# Patient Record
Sex: Male | Born: 1945 | Race: White | Hispanic: No | Marital: Married | Smoking: Former smoker
Health system: Southern US, Community
[De-identification: ages and names within clinical notes are randomized; demographics above are authoritative.]

## PROBLEM LIST (undated history)

## (undated) DIAGNOSIS — K219 Gastro-esophageal reflux disease without esophagitis: Secondary | ICD-10-CM

## (undated) DIAGNOSIS — M1990 Unspecified osteoarthritis, unspecified site: Secondary | ICD-10-CM

## (undated) DIAGNOSIS — J449 Chronic obstructive pulmonary disease, unspecified: Secondary | ICD-10-CM

## (undated) DIAGNOSIS — I2510 Atherosclerotic heart disease of native coronary artery without angina pectoris: Secondary | ICD-10-CM

## (undated) HISTORY — PX: HERNIA REPAIR: SHX51

## (undated) HISTORY — PX: CATARACT EXTRACTION: SUR2

## (undated) HISTORY — PX: ROTATOR CUFF REPAIR: SHX139

## (undated) HISTORY — DX: Chronic obstructive pulmonary disease, unspecified: J44.9

## (undated) HISTORY — PX: TONSILLECTOMY: SUR1361

## (undated) HISTORY — PX: COLONOSCOPY: SHX174

## (undated) HISTORY — PX: WISDOM TOOTH EXTRACTION: SHX21

## (undated) HISTORY — DX: Atherosclerotic heart disease of native coronary artery without angina pectoris: I25.10

## (undated) HISTORY — PX: TOTAL KNEE ARTHROPLASTY: SHX125

---

## 1969-12-22 HISTORY — PX: HERNIA REPAIR: SHX51

## 2003-11-17 ENCOUNTER — Ambulatory Visit (HOSPITAL_COMMUNITY): Admission: RE | Admit: 2003-11-17 | Discharge: 2003-11-17 | Payer: Self-pay | Admitting: Family Medicine

## 2004-07-11 ENCOUNTER — Ambulatory Visit (HOSPITAL_COMMUNITY): Admission: RE | Admit: 2004-07-11 | Discharge: 2004-07-11 | Payer: Self-pay | Admitting: Family Medicine

## 2006-01-19 ENCOUNTER — Encounter: Admission: RE | Admit: 2006-01-19 | Discharge: 2006-01-19 | Payer: Self-pay | Admitting: Internal Medicine

## 2010-02-20 ENCOUNTER — Encounter: Admission: RE | Admit: 2010-02-20 | Discharge: 2010-02-20 | Payer: Self-pay | Admitting: Orthopedic Surgery

## 2010-06-12 ENCOUNTER — Inpatient Hospital Stay (HOSPITAL_COMMUNITY): Admission: RE | Admit: 2010-06-12 | Discharge: 2010-06-16 | Payer: Self-pay | Admitting: Orthopedic Surgery

## 2010-11-12 ENCOUNTER — Inpatient Hospital Stay (HOSPITAL_COMMUNITY): Admission: RE | Admit: 2010-11-12 | Discharge: 2010-11-15 | Payer: Self-pay | Admitting: Orthopedic Surgery

## 2011-03-04 LAB — CBC
HCT: 28.7 % — ABNORMAL LOW (ref 39.0–52.0)
HCT: 30.3 % — ABNORMAL LOW (ref 39.0–52.0)
HCT: 30.6 % — ABNORMAL LOW (ref 39.0–52.0)
HCT: 44.8 % (ref 39.0–52.0)
Hemoglobin: 10 g/dL — ABNORMAL LOW (ref 13.0–17.0)
Hemoglobin: 10.2 g/dL — ABNORMAL LOW (ref 13.0–17.0)
Hemoglobin: 15.3 g/dL (ref 13.0–17.0)
Hemoglobin: 9.8 g/dL — ABNORMAL LOW (ref 13.0–17.0)
MCH: 29.4 pg (ref 26.0–34.0)
MCH: 29.9 pg (ref 26.0–34.0)
MCH: 30.1 pg (ref 26.0–34.0)
MCH: 30.6 pg (ref 26.0–34.0)
MCHC: 32.7 g/dL (ref 30.0–36.0)
MCHC: 33.7 g/dL (ref 30.0–36.0)
MCHC: 34.1 g/dL (ref 30.0–36.0)
MCHC: 34.2 g/dL (ref 30.0–36.0)
MCV: 88 fL (ref 78.0–100.0)
MCV: 88.9 fL (ref 78.0–100.0)
MCV: 89.6 fL (ref 78.0–100.0)
MCV: 90 fL (ref 78.0–100.0)
Platelets: 189 10*3/uL (ref 150–400)
Platelets: 192 10*3/uL (ref 150–400)
Platelets: 195 10*3/uL (ref 150–400)
Platelets: 269 10*3/uL (ref 150–400)
RBC: 3.26 MIL/uL — ABNORMAL LOW (ref 4.22–5.81)
RBC: 3.4 MIL/uL — ABNORMAL LOW (ref 4.22–5.81)
RBC: 3.41 MIL/uL — ABNORMAL LOW (ref 4.22–5.81)
RBC: 5 MIL/uL (ref 4.22–5.81)
RDW: 13.6 % (ref 11.5–15.5)
RDW: 13.7 % (ref 11.5–15.5)
RDW: 13.7 % (ref 11.5–15.5)
RDW: 13.8 % (ref 11.5–15.5)
WBC: 10 10*3/uL (ref 4.0–10.5)
WBC: 10.2 10*3/uL (ref 4.0–10.5)
WBC: 10.8 10*3/uL — ABNORMAL HIGH (ref 4.0–10.5)
WBC: 12.9 10*3/uL — ABNORMAL HIGH (ref 4.0–10.5)

## 2011-03-04 LAB — BASIC METABOLIC PANEL
BUN: 5 mg/dL — ABNORMAL LOW (ref 6–23)
BUN: 7 mg/dL (ref 6–23)
BUN: 8 mg/dL (ref 6–23)
CO2: 27 mEq/L (ref 19–32)
CO2: 30 mEq/L (ref 19–32)
CO2: 30 mEq/L (ref 19–32)
Calcium: 7.8 mg/dL — ABNORMAL LOW (ref 8.4–10.5)
Calcium: 8 mg/dL — ABNORMAL LOW (ref 8.4–10.5)
Calcium: 9.7 mg/dL (ref 8.4–10.5)
Chloride: 100 mEq/L (ref 96–112)
Chloride: 101 mEq/L (ref 96–112)
Chloride: 99 mEq/L (ref 96–112)
Creatinine, Ser: 0.81 mg/dL (ref 0.4–1.5)
Creatinine, Ser: 0.84 mg/dL (ref 0.4–1.5)
Creatinine, Ser: 0.87 mg/dL (ref 0.4–1.5)
GFR calc Af Amer: >60 mL/min (ref >60)
GFR calc Af Amer: >60 mL/min (ref >60)
GFR calc Af Amer: >60 mL/min (ref >60)
GFR calc non Af Amer: >60 mL/min (ref >60)
GFR calc non Af Amer: >60 mL/min (ref >60)
GFR calc non Af Amer: >60 mL/min (ref >60)
Glucose, Bld: 106 mg/dL — ABNORMAL HIGH (ref 70–99)
Glucose, Bld: 113 mg/dL — ABNORMAL HIGH (ref 70–99)
Glucose, Bld: 86 mg/dL (ref 70–99)
Potassium: 3.5 mEq/L (ref 3.5–5.1)
Potassium: 4.3 mEq/L (ref 3.5–5.1)
Potassium: 4.4 mEq/L (ref 3.5–5.1)
Sodium: 133 mEq/L — ABNORMAL LOW (ref 135–145)
Sodium: 135 mEq/L (ref 135–145)
Sodium: 137 mEq/L (ref 135–145)

## 2011-03-04 LAB — PROTIME-INR
INR: 0.94 (ref 0.00–1.49)
INR: 1.09 (ref 0.00–1.49)
INR: 1.76 — ABNORMAL HIGH (ref 0.00–1.49)
INR: 2.49 — ABNORMAL HIGH (ref 0.00–1.49)
Prothrombin Time: 12.8 seconds (ref 11.6–15.2)
Prothrombin Time: 14.3 seconds (ref 11.6–15.2)
Prothrombin Time: 20.7 seconds — ABNORMAL HIGH (ref 11.6–15.2)
Prothrombin Time: 27 seconds — ABNORMAL HIGH (ref 11.6–15.2)

## 2011-03-04 LAB — URINALYSIS, ROUTINE W REFLEX MICROSCOPIC
Bilirubin Urine: NEGATIVE
Glucose, UA: NEGATIVE mg/dL
Hgb urine dipstick: NEGATIVE
Ketones, ur: NEGATIVE mg/dL
Nitrite: NEGATIVE
Protein, ur: NEGATIVE mg/dL
Specific Gravity, Urine: 1.016 (ref 1.005–1.030)
Urobilinogen, UA: 0.2 mg/dL (ref 0.0–1.0)
pH: 5.5 (ref 5.0–8.0)

## 2011-03-04 LAB — TYPE AND SCREEN
ABO/RH(D): A POS
Antibody Screen: NEGATIVE

## 2011-03-04 LAB — SURGICAL PCR SCREEN
MRSA, PCR: NEGATIVE
Staphylococcus aureus: NEGATIVE

## 2011-03-09 LAB — URINALYSIS, ROUTINE W REFLEX MICROSCOPIC
Bilirubin Urine: NEGATIVE
Glucose, UA: NEGATIVE mg/dL
Hgb urine dipstick: NEGATIVE
Ketones, ur: NEGATIVE mg/dL
Nitrite: NEGATIVE
Protein, ur: NEGATIVE mg/dL
Specific Gravity, Urine: 1.006 (ref 1.005–1.030)
Urobilinogen, UA: 0.2 mg/dL (ref 0.0–1.0)
pH: 6.5 (ref 5.0–8.0)

## 2011-03-09 LAB — BASIC METABOLIC PANEL
BUN: 5 mg/dL — ABNORMAL LOW (ref 6–23)
BUN: 7 mg/dL (ref 6–23)
Calcium: 8 mg/dL — ABNORMAL LOW (ref 8.4–10.5)
Calcium: 9 mg/dL (ref 8.4–10.5)
Calcium: 9.6 mg/dL (ref 8.4–10.5)
Chloride: 99 mEq/L (ref 96–112)
Creatinine, Ser: 0.92 mg/dL (ref 0.4–1.5)
Creatinine, Ser: 0.93 mg/dL (ref 0.4–1.5)
GFR calc Af Amer: >60 mL/min (ref >60)
GFR calc Af Amer: >60 mL/min (ref >60)
GFR calc non Af Amer: >60 mL/min (ref >60)
GFR calc non Af Amer: >60 mL/min (ref >60)
GFR calc non Af Amer: >60 mL/min (ref >60)
Potassium: 4.3 mEq/L (ref 3.5–5.1)
Sodium: 137 mEq/L (ref 135–145)
Sodium: 140 mEq/L (ref 135–145)

## 2011-03-09 LAB — ABO/RH: ABO/RH(D): A POS

## 2011-03-09 LAB — CBC
HCT: 44 % (ref 39.0–52.0)
Hemoglobin: 11 g/dL — ABNORMAL LOW (ref 13.0–17.0)
Hemoglobin: 9.8 g/dL — ABNORMAL LOW (ref 13.0–17.0)
MCH: 32.1 pg (ref 26.0–34.0)
MCH: 32.6 pg (ref 26.0–34.0)
MCHC: 33.7 g/dL (ref 30.0–36.0)
Platelets: 161 10*3/uL (ref 150–400)
Platelets: 168 10*3/uL (ref 150–400)
Platelets: 177 10*3/uL (ref 150–400)
Platelets: 230 10*3/uL (ref 150–400)
RBC: 3.04 MIL/uL — ABNORMAL LOW (ref 4.22–5.81)
RBC: 3.38 MIL/uL — ABNORMAL LOW (ref 4.22–5.81)
RBC: 3.68 MIL/uL — ABNORMAL LOW (ref 4.22–5.81)
RDW: 14 % (ref 11.5–15.5)
WBC: 11.5 10*3/uL — ABNORMAL HIGH (ref 4.0–10.5)
WBC: 12.2 10*3/uL — ABNORMAL HIGH (ref 4.0–10.5)

## 2011-03-09 LAB — PROTIME-INR
INR: 0.95 (ref 0.00–1.49)
INR: 1.1 (ref 0.00–1.49)
INR: 1.54 — ABNORMAL HIGH (ref 0.00–1.49)
INR: 1.87 — ABNORMAL HIGH (ref 0.00–1.49)
INR: 2.12 — ABNORMAL HIGH (ref 0.00–1.49)
Prothrombin Time: 12.6 seconds (ref 11.6–15.2)
Prothrombin Time: 14.1 seconds (ref 11.6–15.2)
Prothrombin Time: 18.4 seconds — ABNORMAL HIGH (ref 11.6–15.2)
Prothrombin Time: 21.4 seconds — ABNORMAL HIGH (ref 11.6–15.2)

## 2011-03-09 LAB — DIFFERENTIAL
Eosinophils Relative: 2 % (ref 0–5)
Lymphocytes Relative: 33 % (ref 12–46)
Lymphs Abs: 4 10*3/uL (ref 0.7–4.0)
Neutro Abs: 7.1 10*3/uL (ref 1.7–7.7)

## 2011-03-09 LAB — TYPE AND SCREEN
ABO/RH(D): A POS
Antibody Screen: NEGATIVE

## 2011-03-09 LAB — URINE CULTURE
Colony Count: NO GROWTH
Culture: NO GROWTH

## 2011-03-09 LAB — APTT: aPTT: 25 seconds (ref 24–37)

## 2014-04-13 ENCOUNTER — Other Ambulatory Visit: Payer: Self-pay | Admitting: Orthopedic Surgery

## 2014-04-13 DIAGNOSIS — M545 Low back pain, unspecified: Secondary | ICD-10-CM

## 2014-05-02 ENCOUNTER — Ambulatory Visit
Admission: RE | Admit: 2014-05-02 | Discharge: 2014-05-02 | Disposition: A | Discharge status: Home / Self Care | Payer: Medicare Other | Source: Ambulatory Visit | Attending: Orthopedic Surgery | Admitting: Orthopedic Surgery

## 2014-05-02 DIAGNOSIS — M545 Low back pain, unspecified: Secondary | ICD-10-CM

## 2014-08-07 ENCOUNTER — Other Ambulatory Visit: Payer: Self-pay | Admitting: Family Medicine

## 2014-08-07 DIAGNOSIS — Z139 Encounter for screening, unspecified: Secondary | ICD-10-CM

## 2014-08-11 ENCOUNTER — Ambulatory Visit: Payer: Medicare Other

## 2014-08-16 ENCOUNTER — Ambulatory Visit
Admission: RE | Admit: 2014-08-16 | Discharge: 2014-08-16 | Disposition: A | Discharge status: Home / Self Care | Payer: Medicare Other | Source: Ambulatory Visit | Attending: Family Medicine | Admitting: Family Medicine

## 2014-08-16 DIAGNOSIS — Z139 Encounter for screening, unspecified: Secondary | ICD-10-CM

## 2015-12-31 DIAGNOSIS — R300 Dysuria: Secondary | ICD-10-CM | POA: Diagnosis not present

## 2016-02-08 DIAGNOSIS — N481 Balanitis: Secondary | ICD-10-CM | POA: Diagnosis not present

## 2016-02-08 DIAGNOSIS — R300 Dysuria: Secondary | ICD-10-CM | POA: Diagnosis not present

## 2016-06-03 DIAGNOSIS — N471 Phimosis: Secondary | ICD-10-CM | POA: Diagnosis not present

## 2016-11-05 DIAGNOSIS — K219 Gastro-esophageal reflux disease without esophagitis: Secondary | ICD-10-CM | POA: Diagnosis not present

## 2016-11-05 DIAGNOSIS — Z23 Encounter for immunization: Secondary | ICD-10-CM | POA: Diagnosis not present

## 2016-11-05 DIAGNOSIS — E7800 Pure hypercholesterolemia, unspecified: Secondary | ICD-10-CM | POA: Diagnosis not present

## 2016-11-05 DIAGNOSIS — Z0000 Encounter for general adult medical examination without abnormal findings: Secondary | ICD-10-CM | POA: Diagnosis not present

## 2016-11-05 DIAGNOSIS — Z125 Encounter for screening for malignant neoplasm of prostate: Secondary | ICD-10-CM | POA: Diagnosis not present

## 2017-01-20 DIAGNOSIS — H6122 Impacted cerumen, left ear: Secondary | ICD-10-CM | POA: Diagnosis not present

## 2017-06-29 DIAGNOSIS — Z800 Family history of malignant neoplasm of digestive organs: Secondary | ICD-10-CM | POA: Diagnosis not present

## 2017-06-29 DIAGNOSIS — K5730 Diverticulosis of large intestine without perforation or abscess without bleeding: Secondary | ICD-10-CM | POA: Diagnosis not present

## 2017-06-29 DIAGNOSIS — Z86010 Personal history of colonic polyps: Secondary | ICD-10-CM | POA: Diagnosis not present

## 2017-06-29 DIAGNOSIS — D126 Benign neoplasm of colon, unspecified: Secondary | ICD-10-CM | POA: Diagnosis not present

## 2017-06-29 DIAGNOSIS — K640 First degree hemorrhoids: Secondary | ICD-10-CM | POA: Diagnosis not present

## 2017-06-30 DIAGNOSIS — D126 Benign neoplasm of colon, unspecified: Secondary | ICD-10-CM | POA: Diagnosis not present

## 2017-10-26 DIAGNOSIS — H26493 Other secondary cataract, bilateral: Secondary | ICD-10-CM | POA: Diagnosis not present

## 2017-10-26 DIAGNOSIS — Z961 Presence of intraocular lens: Secondary | ICD-10-CM | POA: Diagnosis not present

## 2017-10-26 DIAGNOSIS — H26492 Other secondary cataract, left eye: Secondary | ICD-10-CM | POA: Diagnosis not present

## 2017-11-06 DIAGNOSIS — Z23 Encounter for immunization: Secondary | ICD-10-CM | POA: Diagnosis not present

## 2017-11-06 DIAGNOSIS — E7800 Pure hypercholesterolemia, unspecified: Secondary | ICD-10-CM | POA: Diagnosis not present

## 2017-11-06 DIAGNOSIS — M150 Primary generalized (osteo)arthritis: Secondary | ICD-10-CM | POA: Diagnosis not present

## 2017-11-06 DIAGNOSIS — Z0000 Encounter for general adult medical examination without abnormal findings: Secondary | ICD-10-CM | POA: Diagnosis not present

## 2017-11-06 DIAGNOSIS — Z125 Encounter for screening for malignant neoplasm of prostate: Secondary | ICD-10-CM | POA: Diagnosis not present

## 2017-11-06 DIAGNOSIS — K219 Gastro-esophageal reflux disease without esophagitis: Secondary | ICD-10-CM | POA: Diagnosis not present

## 2017-11-09 DIAGNOSIS — Z961 Presence of intraocular lens: Secondary | ICD-10-CM | POA: Diagnosis not present

## 2017-11-09 DIAGNOSIS — H26491 Other secondary cataract, right eye: Secondary | ICD-10-CM | POA: Diagnosis not present

## 2018-11-09 DIAGNOSIS — M150 Primary generalized (osteo)arthritis: Secondary | ICD-10-CM | POA: Diagnosis not present

## 2018-11-09 DIAGNOSIS — E7800 Pure hypercholesterolemia, unspecified: Secondary | ICD-10-CM | POA: Diagnosis not present

## 2018-11-09 DIAGNOSIS — F17200 Nicotine dependence, unspecified, uncomplicated: Secondary | ICD-10-CM | POA: Diagnosis not present

## 2018-11-09 DIAGNOSIS — Z0000 Encounter for general adult medical examination without abnormal findings: Secondary | ICD-10-CM | POA: Diagnosis not present

## 2018-11-09 DIAGNOSIS — Z23 Encounter for immunization: Secondary | ICD-10-CM | POA: Diagnosis not present

## 2018-11-09 DIAGNOSIS — Z125 Encounter for screening for malignant neoplasm of prostate: Secondary | ICD-10-CM | POA: Diagnosis not present

## 2019-02-14 DIAGNOSIS — R142 Eructation: Secondary | ICD-10-CM | POA: Diagnosis not present

## 2019-06-01 ENCOUNTER — Other Ambulatory Visit: Payer: Self-pay | Admitting: *Deleted

## 2019-06-01 NOTE — Patient Outreach (Signed)
[facility]) Care Management  06/01/2019  Kevin Lozano [birth year] [id number]   Subjective: Telephone call to patient's home number, no answer, message states invalid or disconnected number. Telephone call to patient's mobile number, no answer, left HIPAA compliant voicemail message, and requested call back.    Objective: Per KPN (Knowledge Performance Now, point of care tool) and chart review, patient has had no recent ED visits or hospitalizations.   Patient has a history of osteoarthritis and hypercholesteremia.    Assessment: Received [employer] Nurse Call Line follow up referral on 05/31/2019. Referral reason: Patient called nurse line states possible spider bite on forearm, area swollen with white head, and nurse call line advised patient to see primary MD within 4 hours.  Screening  follow up pending patient contact.     Plan: RNCM will call patient for 2nd telephone outreach attempt within 4 business days, nurse line screening follow up, and will proceed with case closure within 10 business days if no return call.    Dorcus Riga H. Annia Friendly, BSN, [place] [facility] Telephonic CM Phone: [phone] Fax: [phone]

## 2019-06-02 ENCOUNTER — Other Ambulatory Visit: Payer: Self-pay | Admitting: *Deleted

## 2019-06-02 NOTE — Patient Outreach (Addendum)
[facility]) Care Management  06/02/2019  JW COVIN 06/13/1946 [id number]   Subjective: Received voicemail message from Allean Found, states he is doing well, returning call, and requested call back. Telephone call to patient's mobile number, spoke with patient, and HIPAA verified.  Patient states he is currently in [place] and will call this RNCM back at a later time.   Telephone call to patient's mobile number, spoke with patient, and HIPAA verified.  Discussed [facility] Care Management [facility] Nurse Call Line  follow up, patient voiced understanding, and is in agreement to follow up.   Patient states he is doing well, attempted to go to 5 different urgent care facilities on 05/30/2019, per nurse line advice, and they were all closed.   States he took amoxicillin that he has on hand for pre-dental procedures, washed the spider bite area well, applied topical ointment, applied ice, and feels much better.   States the area has decreased swelling. Discussed importance of follow up with primary MD, patient voices understanding, and states he will follow up as appropriate.  Patient states he is able to manage self care and has assistance as needed.  Patient states he does not have any education material, nurse call line, care coordination, disease management, disease monitoring, transportation, community resource, or pharmacy needs at this time.  States he is very appreciative of the follow up.    Objective: Per KPN (Knowledge Performance Now, point of care tool) and chart review, patient has had no recent ED visits or hospitalizations.   Patient has a history of osteoarthritis and hypercholesteremia.    Assessment: Received [employer] Nurse Call Line follow up referral on 05/31/2019. Referral reason: Patient called nurse line states possible spider bite on forearm, area swollen with white head, and nurse call line advised patient to see primary MD within 4  hours.  Screening follow up completed and no further care management needs.      Plan: RNCM will complete case closure due to follow up completed / no care management needs.      Merrit Friesen H. Annia Friendly, BSN, [place] [facility] Telephonic CM Phone: [phone] Fax: [phone]

## 2019-11-15 DIAGNOSIS — F17200 Nicotine dependence, unspecified, uncomplicated: Secondary | ICD-10-CM | POA: Diagnosis not present

## 2019-11-15 DIAGNOSIS — Z0000 Encounter for general adult medical examination without abnormal findings: Secondary | ICD-10-CM | POA: Diagnosis not present

## 2019-11-15 DIAGNOSIS — E7800 Pure hypercholesterolemia, unspecified: Secondary | ICD-10-CM | POA: Diagnosis not present

## 2019-11-15 DIAGNOSIS — M150 Primary generalized (osteo)arthritis: Secondary | ICD-10-CM | POA: Diagnosis not present

## 2019-11-15 DIAGNOSIS — Z23 Encounter for immunization: Secondary | ICD-10-CM | POA: Diagnosis not present

## 2019-11-15 DIAGNOSIS — K219 Gastro-esophageal reflux disease without esophagitis: Secondary | ICD-10-CM | POA: Diagnosis not present

## 2019-12-23 DIAGNOSIS — Z8616 Personal history of COVID-19: Secondary | ICD-10-CM

## 2019-12-23 HISTORY — DX: Personal history of COVID-19: Z86.16

## 2020-02-29 ENCOUNTER — Other Ambulatory Visit: Payer: Self-pay

## 2020-02-29 ENCOUNTER — Ambulatory Visit: Payer: PPO | Admitting: Orthopedic Surgery

## 2020-02-29 ENCOUNTER — Ambulatory Visit (INDEPENDENT_AMBULATORY_CARE_PROVIDER_SITE_OTHER): Payer: PPO

## 2020-02-29 ENCOUNTER — Encounter: Payer: Self-pay | Admitting: Orthopedic Surgery

## 2020-02-29 DIAGNOSIS — M25511 Pain in right shoulder: Secondary | ICD-10-CM

## 2020-03-01 ENCOUNTER — Encounter: Payer: Self-pay | Admitting: Orthopedic Surgery

## 2020-03-01 NOTE — Progress Notes (Signed)
   Office Visit Note   Patient: Kevin Lozano           Date of Birth: March 24, 1946           MRN: [id number] Visit Date: 02/29/2020 Requested by: Shirline Frees, MD [address] [place],  [place] [address] PCP: Shirline Frees, MD  Subjective: Chief Complaint  Patient presents with  . Right Shoulder - Pain    HPI: Chavez is a patient with right shoulder pain.  Felt a pop 3 weeks ago when he was trying to push his studies back.  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  Occasionally the pain will wake him from sleep at night.  He is right-hand dominant.  Had a left shoulder MRI scan about 10 years ago which showed some mild glenohumeral joint arthritis.  Used to be that the left hurts more than the right but now the right starting little bit more.  Takes Advil as needed for symptoms.              ROS: All systems reviewed are negative as they relate to the chief complaint within the history of present illness.  Patient denies  fevers or chills.   Assessment & Plan: Visit Diagnoses:  1. Right shoulder pain, unspecified chronicity     Plan: Impression is right shoulder pain with normal radiographs and reasonable rotator cuff strength.  He may have a small rotator cuff tear which we just cannot really tell on exam.  I think in general he is pretty functional with the arm.  No Popeye deformity today.  Plan 6-week return decide for or against injection versus MRI.  Follow-Up Instructions: No follow-ups on file.   Orders:  Orders Placed This Encounter  Procedures  . XR Shoulder Right   No orders of the defined types were placed in this encounter.     Procedures: No procedures performed   Clinical Data: No additional findings.  Objective: Vital Signs: There were no vitals taken for this visit.  Physical Exam:   Constitutional: Patient appears well-developed HEENT:  Head: Normocephalic Eyes:EOM are  normal Neck: Normal range of motion Cardiovascular: Normal rate Pulmonary/chest: Effort normal Neurologic: Patient is alert Skin: Skin is warm Psychiatric: Patient has normal mood and affect    Ortho Exam: Ortho exam demonstrates full active and passive range of motion of the right shoulder with good cervical spine motion.  Slight weakness to supraspinatus testing on the left compared to the right.  No Popeye deformity.  Not much in the way of coarse grinding or crepitus with internal extra rotation of the arm.  Subscap strength is intact and symmetric bilaterally.  No restriction of external rotation of 15 degrees of abduction.  Specialty Comments:  No specialty comments available.  Imaging: No results found.   PMFS History: There are no problems to display for this patient.  History reviewed. No pertinent past medical history.  History reviewed. No pertinent family history.  History reviewed. No pertinent surgical history. Social History   Occupational History  . Not on file  Tobacco Use  . Smoking status: Not on file  Substance and Sexual Activity  . Alcohol use: Not on file  . Drug use: Not on file  . Sexual activity: Not on file

## 2020-03-12 DIAGNOSIS — H6980 Other specified disorders of Eustachian tube, unspecified ear: Secondary | ICD-10-CM | POA: Diagnosis not present

## 2020-03-12 DIAGNOSIS — H6122 Impacted cerumen, left ear: Secondary | ICD-10-CM | POA: Diagnosis not present

## 2020-03-12 DIAGNOSIS — R42 Dizziness and giddiness: Secondary | ICD-10-CM | POA: Diagnosis not present

## 2020-04-11 ENCOUNTER — Encounter: Payer: Self-pay | Admitting: Orthopedic Surgery

## 2020-04-11 ENCOUNTER — Other Ambulatory Visit: Payer: Self-pay

## 2020-04-11 ENCOUNTER — Ambulatory Visit: Payer: PPO | Admitting: Orthopedic Surgery

## 2020-04-11 DIAGNOSIS — M75121 Complete rotator cuff tear or rupture of right shoulder, not specified as traumatic: Secondary | ICD-10-CM

## 2020-04-11 NOTE — Addendum Note (Signed)
Addended byLaurann Montana on: 04/11/2020 09:00 AM   Modules accepted: Orders

## 2020-04-11 NOTE — Progress Notes (Signed)
   Office Visit Note   Patient: Kevin Lozano           Date of Birth: 01-15-1946           MRN: [id number] Visit Date: 04/11/2020 Requested by: Shirline Frees, MD [place],  [place] [address] PCP: Shirline Frees, MD  Subjective: Chief Complaint  Patient presents with  . Right Shoulder - Follow-up    HPI: Kevin Lozano is a 74 year old patient with right shoulder pain.  Describes pain more or less all the time every day.  Been taking ibuprofen.  Does describe an injury pushing a heavy object several months ago.  Describes weakness and mechanical symptoms in the right shoulder.  Denies any radicular symptoms.              ROS: All systems reviewed are negative as they relate to the chief complaint within the history of present illness.  Patient denies  fevers or chills.   Assessment & Plan: Visit Diagnoses: No diagnosis found.  Plan: Impression is right shoulder rotator cuff tear.  74 year old smoker who likes to play golf.  He is not able to play golf.  Has pain which interferes with sleep as well.  Has failed conservative measures over the past several months.  Does have exam findings consistent with rotator cuff tear pathology.  Plan at this time is MRI arthrogram to evaluate for rotator cuff tear.  Big tear may or may not be repairable small tear could be repairable.  He is a smoker which is not in his favor.  I will see him back after that study  Follow-Up Instructions: Return for after MRI.   Orders:  No orders of the defined types were placed in this encounter.  No orders of the defined types were placed in this encounter.     Procedures: No procedures performed   Clinical Data: No additional findings.  Objective: Vital Signs: There were no vitals taken for this visit.  Physical Exam:   Constitutional: Patient appears well-developed HEENT:  Head: Normocephalic Eyes:EOM are normal Neck: Normal range of motion Cardiovascular: Normal  rate Pulmonary/chest: Effort normal Neurologic: Patient is alert Skin: Skin is warm Psychiatric: Patient has normal mood and affect    Ortho Exam: Ortho exam demonstrates no loss of passive range of motion between right and left shoulder.  Does have some coarseness and grinding with internal/external rotation at 15 degrees of abduction.  No other masses lymphadenopathy or skin changes noted in the shoulder girdle region.  Does have some weakness to infraspinatus testing on the right but not the left.  Subscap strength testing is intact.  No Popeye deformity present in the right arm.  No discrete AC joint tenderness on the right.  Equivocal O'Brien's testing.  No instability symptoms or signs.  Specialty Comments:  No specialty comments available.  Imaging: No results found.   PMFS History: There are no problems to display for this patient.  History reviewed. No pertinent past medical history.  History reviewed. No pertinent family history.  History reviewed. No pertinent surgical history. Social History   Occupational History  . Not on file  Tobacco Use  . Smoking status: Former Research scientist (life sciences)  . Smokeless tobacco: Never Used  Substance and Sexual Activity  . Alcohol use: Not on file  . Drug use: Not on file  . Sexual activity: Not on file

## 2020-04-19 ENCOUNTER — Ambulatory Visit
Admission: RE | Admit: 2020-04-19 | Discharge: 2020-04-19 | Disposition: A | Discharge status: Home / Self Care | Payer: PPO | Source: Ambulatory Visit | Attending: Orthopedic Surgery | Admitting: Orthopedic Surgery

## 2020-04-19 ENCOUNTER — Other Ambulatory Visit: Payer: Self-pay

## 2020-04-19 DIAGNOSIS — M25511 Pain in right shoulder: Secondary | ICD-10-CM | POA: Diagnosis not present

## 2020-04-19 DIAGNOSIS — M75121 Complete rotator cuff tear or rupture of right shoulder, not specified as traumatic: Secondary | ICD-10-CM

## 2020-04-19 MED ORDER — IOPAMIDOL (ISOVUE-M 200) INJECTION 41%
12.0000 mL | Freq: Once | INTRAMUSCULAR | Status: AC
  Administered 2020-04-19: 16:00:00 12 mL via INTRA_ARTICULAR

## 2020-04-27 DIAGNOSIS — J0110 Acute frontal sinusitis, unspecified: Secondary | ICD-10-CM | POA: Diagnosis not present

## 2020-05-01 DIAGNOSIS — J0110 Acute frontal sinusitis, unspecified: Secondary | ICD-10-CM | POA: Diagnosis not present

## 2020-05-01 DIAGNOSIS — Z1152 Encounter for screening for COVID-19: Secondary | ICD-10-CM | POA: Diagnosis not present

## 2020-05-09 ENCOUNTER — Ambulatory Visit: Payer: PPO | Admitting: Orthopedic Surgery

## 2020-05-09 ENCOUNTER — Other Ambulatory Visit: Payer: Self-pay

## 2020-05-09 ENCOUNTER — Encounter: Payer: Self-pay | Admitting: Orthopedic Surgery

## 2020-05-09 VITALS — Ht 67.5 in | Wt 157.0 lb

## 2020-05-09 DIAGNOSIS — M75101 Unspecified rotator cuff tear or rupture of right shoulder, not specified as traumatic: Secondary | ICD-10-CM | POA: Diagnosis not present

## 2020-05-11 ENCOUNTER — Encounter: Payer: Self-pay | Admitting: Orthopedic Surgery

## 2020-05-11 NOTE — Progress Notes (Signed)
Office Visit Note   Patient: Kevin Lozano           Date of Birth: 1946-06-17           MRN: [id number] Visit Date: 05/09/2020 Requested by: Shirline Frees, MD [place],  [place] [address] PCP: Shirline Frees, MD  Subjective: Chief Complaint  Patient presents with  . Right Shoulder - Pain, Follow-up    MRI Review Right Shoulder    HPI: Kevin Lozano is a 74 y.o. male who presents to the office complaining of right shoulder pain.  Patient presents for MRI review of right shoulder.  MRI revealed complete full-thickness tear of the supraspinatus tendon with about 1 cm of retraction as well as mild arthritis of the glenohumeral/AC joints.  Patient notes that his symptoms are little worse than his last office visit.  He has been having difficulty sleeping.  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  He has been taking ibuprofen with some relief.  He quit smoking on Saturday but does have a history of smoking cigarettes.                ROS:  All systems reviewed are negative as they relate to the chief complaint within the history of present illness.  Patient denies fevers or chills.  Assessment & Plan: Visit Diagnoses:  1. Tear of right supraspinatus tendon     Plan: Patient is a 74 year old male presents complaining of right shoulder pain.  He presents for MRI review.  MRI revealed supraspinatus tear with about 1 cm of retraction.  After discussion of options, patient wishes to proceed with rotator cuff repair.  Discussed the risks and benefits of the procedure.  Patient will be scheduled for surgery and follow-up after procedure for postoperative check.  Prescribed Vicodin after surgery rather than oxycodone or morphine due to patient's history of reactions to these medications.  Risk and benefits of shoulder rotator cuff tear discussed.  Primary risk include tendon nonhealing in this patient due to his history of smoking.  Additionally  the risk of stiffness loss of motion in incomplete pain relief also discussed.  Would likely consider biceps tenodesis or tenotomy at the time of surgery.  Patient understands risk benefits and extensive rehabilitative effort required.  All questions answered.  I think for his purposes of golf the shoulder should be functional for that activity after about 3 months.  Follow-Up Instructions: No follow-ups on file.   Orders:  No orders of the defined types were placed in this encounter.  No orders of the defined types were placed in this encounter.     Procedures: No procedures performed   Clinical Data: No additional findings.  Objective: Vital Signs: Ht 5' 7.5" (1.715 m)   Wt 157 lb (71.2 kg)   BMI 24.23 kg/m   Physical Exam:  Constitutional: Patient appears well-developed HEENT:  Head: Normocephalic Eyes:EOM are normal Neck: Normal range of motion Cardiovascular: Normal rate Pulmonary/chest: Effort normal Neurologic: Patient is alert Skin: Skin is warm Psychiatric: Patient has normal mood and affect  Ortho Exam:  Right shoulder Exam Able to forward flex and abduct shoulder overhead No loss of ER relative to the other shoulder.  Good endpoint with ER Tender to palpation over the bicipital groove.  No tenderness to palpation over the [facility] joint. Good subscapularis, infraspinatus strength.  Weakness with supraspinatus resistance testing Negative Hawkins impingement 5/5 grip strength, forearm pronation/supination, and  bicep strength  Specialty Comments:  No specialty comments available.  Imaging: No results found.   PMFS History: There are no problems to display for this patient.  No past medical history on file.  No family history on file.  No past surgical history on file. Social History   Occupational History  . Not on file  Tobacco Use  . Smoking status: Former Research scientist (life sciences)  . Smokeless tobacco: Never Used  Substance and Sexual Activity  . Alcohol use: Not  on file  . Drug use: Not on file  . Sexual activity: Not on file

## 2020-06-04 ENCOUNTER — Other Ambulatory Visit: Payer: Self-pay | Admitting: Surgical

## 2020-06-04 ENCOUNTER — Encounter: Payer: Self-pay | Admitting: Orthopedic Surgery

## 2020-06-04 ENCOUNTER — Inpatient Hospital Stay: Payer: PPO | Admitting: Orthopedic Surgery

## 2020-06-04 DIAGNOSIS — M75121 Complete rotator cuff tear or rupture of right shoulder, not specified as traumatic: Secondary | ICD-10-CM | POA: Diagnosis not present

## 2020-06-04 DIAGNOSIS — M7521 Bicipital tendinitis, right shoulder: Secondary | ICD-10-CM | POA: Diagnosis not present

## 2020-06-04 DIAGNOSIS — M659 Synovitis and tenosynovitis, unspecified: Secondary | ICD-10-CM | POA: Diagnosis not present

## 2020-06-04 DIAGNOSIS — G8918 Other acute postprocedural pain: Secondary | ICD-10-CM | POA: Diagnosis not present

## 2020-06-04 MED ORDER — HYDROCODONE-ACETAMINOPHEN 5-325 MG PO TABS: 1.0000 | ORAL_TABLET | ORAL | 0 refills | Status: DC | PRN

## 2020-06-04 MED ORDER — METHOCARBAMOL 500 MG PO TABS
500.0000 mg | ORAL_TABLET | Freq: Three times a day (TID) | ORAL | 0 refills | Status: DC | PRN
Start: 2020-06-04 — End: 2021-03-20

## 2020-06-04 MED ORDER — ASPIRIN EC 81 MG PO TBEC: 81.0000 mg | DELAYED_RELEASE_TABLET | Freq: Every day | ORAL | 0 refills | Status: DC

## 2020-06-13 ENCOUNTER — Ambulatory Visit (INDEPENDENT_AMBULATORY_CARE_PROVIDER_SITE_OTHER): Payer: PPO | Admitting: Orthopedic Surgery

## 2020-06-13 DIAGNOSIS — M75101 Unspecified rotator cuff tear or rupture of right shoulder, not specified as traumatic: Secondary | ICD-10-CM

## 2020-06-16 ENCOUNTER — Encounter: Payer: Self-pay | Admitting: Orthopedic Surgery

## 2020-06-16 NOTE — Progress Notes (Signed)
   Post-Op Visit Note   Patient: Kevin Lozano           Date of Birth: 01/12/1946           MRN: [id number] Visit Date: 06/13/2020 PCP: Shirline Frees, MD   Assessment & Plan:  Chief Complaint:  Chief Complaint  Patient presents with  . Right Shoulder - Routine Post Op   Visit Diagnoses: No diagnosis found.  Plan: Patient is a 74 year old male presents s/p right shoulder rotator cuff repair on 06/04/2020.  He is doing well overall.  He is up to 90 degrees on CPM machine.  His pain is well controlled and he has only had to take about 5 to 6 pills of his opioid medications and surgery.  He is sleeping in a recliner and doing well with that.  He has held off on lifting anything with the operative extremity.  Incisions are healing well.  Sutures were removed today and Steri-Strips applied.  Continue using the sling and plan to start physical therapy in 2 weeks.  He may discontinue the sling in 11 days.  Continue using CPM machine.  On exam he has 30 degrees external rotation, 75 to 80 degrees abduction, 90 degrees forward flexion.  Follow-up in 2 weeks for clinical recheck.  Follow-Up Instructions: No follow-ups on file.   Orders:  No orders of the defined types were placed in this encounter.  No orders of the defined types were placed in this encounter.   Imaging: No results found.  PMFS History: There are no problems to display for this patient.  No past medical history on file.  No family history on file.  No past surgical history on file. Social History   Occupational History  . Not on file  Tobacco Use  . Smoking status: Former Research scientist (life sciences)  . Smokeless tobacco: Never Used  Substance and Sexual Activity  . Alcohol use: Not on file  . Drug use: Not on file  . Sexual activity: Not on file

## 2020-06-18 ENCOUNTER — Telehealth: Payer: Self-pay | Admitting: Orthopedic Surgery

## 2020-06-18 NOTE — Telephone Encounter (Signed)
Patient called having a question about medical device. Please call patient concerning this matter. Patient phone number is [phone]

## 2020-06-18 NOTE — Telephone Encounter (Signed)
Called patient. He had questions about CPM. Per Dr Marlou Sa ok to go to 115 but no lifting with operative arm.

## 2020-06-27 ENCOUNTER — Ambulatory Visit (INDEPENDENT_AMBULATORY_CARE_PROVIDER_SITE_OTHER): Payer: PPO | Admitting: Orthopedic Surgery

## 2020-06-27 DIAGNOSIS — M75101 Unspecified rotator cuff tear or rupture of right shoulder, not specified as traumatic: Secondary | ICD-10-CM

## 2020-07-01 ENCOUNTER — Encounter: Payer: Self-pay | Admitting: Orthopedic Surgery

## 2020-07-01 NOTE — Progress Notes (Signed)
   Post-Op Visit Note   Patient: Kevin Lozano           Date of Birth: [birth year]           MRN: [id number] Visit Date: 06/27/2020 PCP: Shirline Frees, MD   Assessment & Plan:  Chief Complaint:  Chief Complaint  Patient presents with  . Right Shoulder - Routine Post Op   Visit Diagnoses:  1. Tear of right supraspinatus tendon     Plan: Patient is a 74 year old male who presents s/p right shoulder rotator cuff repair on 06/04/2020.  Is been 3 weeks out from procedure and doing well.  He is in a sling.  He has minimal pain.  Incision is healing well on exam.  He has 50 degrees external rotation, 90 degrees abduction, 120 degrees forward flexion.  He sleeps in a recliner most nights.  He has no real complaints.  He is not taking pain medication very often.  Plan to start physical therapy upstairs 1-2 times a week to work on passive range of motion and active assisted range of motion of the right shoulder.  Plan to start rotator cuff strengthening on 07/27/2020 which will be about 7 weeks out from procedure.  Want to give more time for patient to heal rotator cuff repair before stressing the repair due to his history of smoking.  Patient agreed with this plan.  Follow-up in 3 weeks for clinical recheck.  Follow-Up Instructions: No follow-ups on file.   Orders:  Orders Placed This Encounter  Procedures  . Ambulatory referral to Physical Therapy   No orders of the defined types were placed in this encounter.   Imaging: No results found.  PMFS History: There are no problems to display for this patient.  No past medical history on file.  No family history on file.  No past surgical history on file. Social History   Occupational History  . Not on file  Tobacco Use  . Smoking status: Former Research scientist (life sciences)  . Smokeless tobacco: Never Used  Substance and Sexual Activity  . Alcohol use: Not on file  . Drug use: Not on file  . Sexual activity: Not on file

## 2020-07-02 ENCOUNTER — Other Ambulatory Visit: Payer: Self-pay

## 2020-07-02 ENCOUNTER — Ambulatory Visit: Payer: PPO | Admitting: Physical Therapy

## 2020-07-02 ENCOUNTER — Encounter: Payer: Self-pay | Admitting: Physical Therapy

## 2020-07-02 DIAGNOSIS — R600 Localized edema: Secondary | ICD-10-CM

## 2020-07-02 DIAGNOSIS — M6281 Muscle weakness (generalized): Secondary | ICD-10-CM | POA: Diagnosis not present

## 2020-07-02 DIAGNOSIS — M25511 Pain in right shoulder: Secondary | ICD-10-CM | POA: Diagnosis not present

## 2020-07-02 DIAGNOSIS — M25611 Stiffness of right shoulder, not elsewhere classified: Secondary | ICD-10-CM | POA: Diagnosis not present

## 2020-07-02 NOTE — Therapy (Addendum)
[facility] Physical Therapy [address] [place], [place], [address] Phone: [phone]   Fax:  [phone]  Physical Therapy Evaluation  Patient Details  Name: Kevin Lozano MRN: [id number] Date of Birth: [birth year] Referring Provider (PT): Donella Stade, [place]   Encounter Date: 07/02/2020   PT End of Session - 07/02/20 0859    Visit Number 1    Number of Visits 24    Date for PT Re-Evaluation 09/24/20    Authorization Type healthteam adv    Progress Note Due on Visit 10    PT Start Time 0805    PT Stop Time 0845    PT Time Calculation (min) 40 min    Activity Tolerance Patient tolerated treatment well    Behavior During Therapy [facility] for tasks assessed/performed           History reviewed. No pertinent past medical history.  History reviewed. No pertinent surgical history.  There were no vitals filed for this visit.    Subjective Assessment - 07/02/20 0808    Subjective He had Rt RTC repair 06/04/20. He is right handed. Overall doing well with pain. MD precautions to not start RTC strengthening until 07/27/20.    Pertinent History none    Limitations House hold activities;Lifting;Sitting    Patient Stated Goals get back to playing golf    Currently in Pain? Yes    Pain Score 2     Pain Location Shoulder    Pain Orientation Right    Pain Descriptors / Indicators Aching    Pain Type Surgical pain    Pain Radiating Towards none    Pain Onset 1 to 4 weeks ago    Pain Frequency Intermittent              [facility] PT Assessment - 07/02/20 0001      Assessment   Medical Diagnosis S/P Rt RTC repair 06/04/20    Referring Provider (PT) Magnant, Gerrianne Scale, PA-C    Hand Dominance Right    Next MD Visit 07/18/2020      Precautions   Precaution Comments MD precautions to not start RTC strengthening until 07/27/20.       Balance Screen   Has the patient fallen in the past 6 months No    Has the patient had a decrease in activity level because of a  fear of falling?  No    Is the patient reluctant to leave their home because of a fear of falling?  No      Home Ecologist residence      Prior Function   Level of Independence Independent    Vocation Retired    Leisure Agricultural consultant   Overall Cognitive Status Within Functional Limits for tasks assessed      Sensation   Light Touch Appears Intact      Coordination   Gross Motor Movements are Fluid and Coordinated Yes      ROM / Strength   AROM / PROM / Strength PROM;Strength      PROM   PROM Assessment Site Shoulder    Right/Left Shoulder Right    Right Shoulder Flexion 140 Degrees    Right Shoulder ABduction 125 Degrees   slight scaption   Right Shoulder Internal Rotation 55 Degrees    Right Shoulder External Rotation 65 Degrees      Strength   Overall Strength Comments did not test due to post op precautions  Transfers   Transfers Independent with all Transfers                      Objective measurements completed on examination: See above findings.       [facility] Adult PT Treatment/Exercise - 07/02/20 0001      Exercises   Exercises Shoulder      Shoulder Exercises: Supine   External Rotation AAROM;Right;10 reps    Flexion Right;AAROM;10 reps    ABduction AAROM;Right;10 reps    Other Supine Exercises chest press AAROM 10 reps      Manual Therapy   Manual therapy comments PROM Rt shoulder gentle all planes                  PT Education - 07/02/20 0856    Education Details HEP, post op precautions, POC    Person(s) Educated Patient    Methods Explanation;Demonstration;Verbal cues;Handout    Comprehension Verbalized understanding;Returned demonstration            PT Short Term Goals - 07/02/20 9163      PT SHORT TERM GOAL #1   Title Pt will be I and compliant with initial HEP.    Time 4    Period Weeks    Status New    Target Date 07/30/20      PT SHORT TERM GOAL #2   Title Pt  will improve Rt shoulder PROM to WNL.    Time 4    Period Weeks    Status New             PT Long Term Goals - 07/02/20 0929      PT LONG TERM GOAL #1   Title Pt will be I and compliant with final HEP. (target for all goals 12 weeks 09/24/20)    Time 12    Period Weeks    Status New    Target Date 09/24/20      PT LONG TERM GOAL #2   Title Pt will improve Rt shoulder strength to 5/5 MMT to improve function.    Status New      PT LONG TERM GOAL #3   Title Pt will improve Rt shoulder AROM to WNL    Status New      PT LONG TERM GOAL #4   Title Pt will be able to return to normal ADL's and golf with less than 3/10 overall pain in Rt shoulder.    Status New                  Plan - 07/02/20 8466    Clinical Impression Statement Pt presents with Rt shoulder pain, stiffness, and weakness S/P Rt RTC repair 06/04/20, MD precautions to not start RTC strengthening until 07/27/20. He will benefit from skilled PT to address his funcitonal deficits and improve funcitonal use of his Rt UE.    Personal Factors and Comorbidities Age    Examination-Activity Limitations Carry;Sleep;Lift;Reach Overhead    Examination-Participation Restrictions Cleaning;Driving;Laundry;Yard Work    Stability/Clinical Decision Making Stable/Uncomplicated    Clinical Decision Making Low    Rehab Potential Good    PT Frequency 2x / week    PT Duration 12 weeks    PT Treatment/Interventions ADLs/Self Care Home Management;Cryotherapy;Electrical Stimulation;Iontophoresis 4mg /ml Dexamethasone;Moist Heat;Ultrasound;Therapeutic exercise;Neuromuscular re-education;Manual techniques;Dry needling;Passive range of motion;Taping;Vasopneumatic Device;Joint Manipulations    PT Next Visit Plan AAROM/PRM until 07/27/20 then can begin strength    PT Home Exercise Plan Access Code: [email or web address]  Consulted and Agree with Plan of Care Patient           Patient will benefit from skilled therapeutic intervention in order to  improve the following deficits and impairments:  Decreased activity tolerance, Decreased range of motion, Decreased strength, Hypomobility, Impaired flexibility, Pain  Visit Diagnosis: Acute pain of right shoulder - Plan: PT plan of care cert/re-cert  Muscle weakness (generalized) - Plan: PT plan of care cert/re-cert  Stiffness of right shoulder, not elsewhere classified - Plan: PT plan of care cert/re-cert  Localized edema - Plan: PT plan of care cert/re-cert     Problem List There are no problems to display for this patient.   [name] 07/02/2020, 9:40 AM  [facility] Physical Therapy [address] [place], [place], [address] Phone: [phone]   Fax:  [phone]  Name: Kevin Lozano MRN: [id number] Date of Birth: 1946/10/04

## 2020-07-02 NOTE — Patient Instructions (Signed)
Access Code: [email or web address] URL: [email or web address] Date: 07/02/2020 Prepared by: Elsie Ra  Exercises Supine Shoulder Flexion Extension AAROM with Dowel - 2 x daily - 6 x weekly - 2 sets - 10 reps - 5 hold Supine Shoulder External Rotation in 45 Degrees Abduction AAROM with Dowel - 2 x daily - 6 x weekly - 2 sets - 10 reps - 5 hold Supine Shoulder Abduction AAROM with Dowel - 2 x daily - 6 x weekly - 2 sets - 10 reps - 5 hold Circular Shoulder Pendulum with Table Support - 2 x daily - 6 x weekly - 1 sets - 20 reps Seated Gripping Towel - 2 x daily - 6 x weekly - 2 sets - 25 reps

## 2020-07-04 ENCOUNTER — Ambulatory Visit (INDEPENDENT_AMBULATORY_CARE_PROVIDER_SITE_OTHER): Payer: PPO | Admitting: Physical Therapy

## 2020-07-04 ENCOUNTER — Other Ambulatory Visit: Payer: Self-pay

## 2020-07-04 DIAGNOSIS — M6281 Muscle weakness (generalized): Secondary | ICD-10-CM | POA: Diagnosis not present

## 2020-07-04 DIAGNOSIS — M25511 Pain in right shoulder: Secondary | ICD-10-CM | POA: Diagnosis not present

## 2020-07-04 DIAGNOSIS — M25611 Stiffness of right shoulder, not elsewhere classified: Secondary | ICD-10-CM | POA: Diagnosis not present

## 2020-07-04 DIAGNOSIS — R600 Localized edema: Secondary | ICD-10-CM

## 2020-07-04 NOTE — Therapy (Signed)
[facility] Physical Therapy [address] [place], [place], [address] Phone: [phone]   Fax:  [phone]  Physical Therapy Treatment  Patient Details  Name: Kevin Lozano MRN: [id number] Date of Birth: 1946/08/25 Referring Provider (PT): Donella Stade, [place]   Encounter Date: 07/04/2020   PT End of Session - 07/04/20 1145    Visit Number 2    Number of Visits 24    Date for PT Re-Evaluation 09/24/20    Authorization Type healthteam adv    Progress Note Due on Visit 10    PT Start Time 2297    PT Stop Time 1100    PT Time Calculation (min) 45 min    Activity Tolerance Patient tolerated treatment well    Behavior During Therapy [facility] for tasks assessed/performed           No past medical history on file.  No past surgical history on file.  There were no vitals filed for this visit.   Subjective Assessment - 07/04/20 1140    Subjective relays no pain just soreness and stiffness today in his Rt shoulder.    Pertinent History none    Limitations House hold activities;Lifting;Sitting    Patient Stated Goals get back to playing golf    Pain Onset 1 to 4 weeks ago                             [facility] Adult PT Treatment/Exercise - 07/04/20 0001      Shoulder Exercises: Seated   Other Seated Exercises tableslide PROM for Rt shoulder flexion, abd, ER 20 reps X 5 sec holds ea    Other Seated Exercises posterior shoulder rolls X 20 reps, scapular retraction with arms supported X 20 reps      Shoulder Exercises: Pulleys   Flexion 3 minutes    ABduction 3 minutes      Manual Therapy   Manual therapy comments PROM Rt shoulder gentle all planes, grade 1-2 GH mobs A-P, inferior, distracton                  PT Education - 07/04/20 1142    Education Details HEP revision from wand AAROM to tableslides    Person(s) Educated Patient    Methods Explanation;Demonstration;Verbal cues;Handout    Comprehension Verbalized  understanding;Returned demonstration            PT Short Term Goals - 07/02/20 9892      PT SHORT TERM GOAL #1   Title Pt will be I and compliant with initial HEP.    Time 4    Period Weeks    Status New    Target Date 07/30/20      PT SHORT TERM GOAL #2   Title Pt will improve Rt shoulder PROM to WNL.    Time 4    Period Weeks    Status New             PT Long Term Goals - 07/02/20 0929      PT LONG TERM GOAL #1   Title Pt will be I and compliant with final HEP. (target for all goals 12 weeks 09/24/20)    Time 12    Period Weeks    Status New    Target Date 09/24/20      PT LONG TERM GOAL #2   Title Pt will improve Rt shoulder strength to 5/5 MMT to improve function.  Status New      PT LONG TERM GOAL #3   Title Pt will improve Rt shoulder AROM to WNL    Status New      PT LONG TERM GOAL #4   Title Pt will be able to return to normal ADL's and golf with less than 3/10 overall pain in Rt shoulder.    Status New                 Plan - 07/04/20 1145    Clinical Impression Statement He was having some left shoulder pain after doing HEP consisting of supine dow PROM exericises so revised that today to tableslide PROM which felt better for him and did not bother his Left shoulder. Overall his right shoulder PROM is doing well post op and PT will continue to progress this as able as he is in post op precautions of no strengthening until 07/27/20    Personal Factors and Comorbidities Age    Examination-Activity Limitations Carry;Sleep;Lift;Reach Overhead    Examination-Participation Restrictions Cleaning;Driving;Laundry;Yard Work    Stability/Clinical Decision Making Stable/Uncomplicated    Rehab Potential Good    PT Frequency 2x / week    PT Duration 12 weeks    PT Treatment/Interventions ADLs/Self Care Home Management;Cryotherapy;Electrical Stimulation;Iontophoresis 4mg /ml Dexamethasone;Moist Heat;Ultrasound;Therapeutic exercise;Neuromuscular  re-education;Manual techniques;Dry needling;Passive range of motion;Taping;Vasopneumatic Device;Joint Manipulations    PT Next Visit Plan AAROM/PRM until 07/27/20 then can begin strength    PT Home Exercise Plan Access Code: [email or web address]    Consulted and Agree with Plan of Care Patient           Patient will benefit from skilled therapeutic intervention in order to improve the following deficits and impairments:  Decreased activity tolerance, Decreased range of motion, Decreased strength, Hypomobility, Impaired flexibility, Pain  Visit Diagnosis: Acute pain of right shoulder  Muscle weakness (generalized)  Stiffness of right shoulder, not elsewhere classified  Localized edema     Problem List There are no problems to display for this patient.   [name] 07/04/2020, 11:50 AM  [facility] Physical Therapy [address] [place], [place], [address] Phone: [phone]   Fax:  [phone]  Name: Kevin Lozano MRN: [id number] Date of Birth: 12/01/1946

## 2020-07-11 ENCOUNTER — Other Ambulatory Visit: Payer: Self-pay

## 2020-07-11 ENCOUNTER — Ambulatory Visit: Payer: PPO | Admitting: Physical Therapy

## 2020-07-11 ENCOUNTER — Encounter: Payer: Self-pay | Admitting: Physical Therapy

## 2020-07-11 DIAGNOSIS — R600 Localized edema: Secondary | ICD-10-CM | POA: Diagnosis not present

## 2020-07-11 DIAGNOSIS — M25511 Pain in right shoulder: Secondary | ICD-10-CM | POA: Diagnosis not present

## 2020-07-11 DIAGNOSIS — M25611 Stiffness of right shoulder, not elsewhere classified: Secondary | ICD-10-CM | POA: Diagnosis not present

## 2020-07-11 DIAGNOSIS — M6281 Muscle weakness (generalized): Secondary | ICD-10-CM

## 2020-07-11 NOTE — Therapy (Signed)
[facility] Physical Therapy [address] [place], [place], [address] Phone: [phone]   Fax:  [phone]  Physical Therapy Treatment  Patient Details  Name: Kevin Lozano MRN: [id number] Date of Birth: 08-20-1946 Referring Provider (PT): Donella Stade, [place]   Encounter Date: 07/11/2020   PT End of Session - 07/11/20 0930    Visit Number 3    Number of Visits 24    Date for PT Re-Evaluation 09/24/20    Authorization Type healthteam adv    Progress Note Due on Visit 10    PT Start Time 0845    PT Stop Time 0927    PT Time Calculation (min) 42 min    Activity Tolerance Patient tolerated treatment well    Behavior During Therapy [facility] for tasks assessed/performed           History reviewed. No pertinent past medical history.  History reviewed. No pertinent surgical history.  There were no vitals filed for this visit.   Subjective Assessment - 07/11/20 0909    Subjective relays no pain upon arrival, just stiffness, he has been performing his HEP    Pertinent History none    Limitations House hold activities;Lifting;Sitting    Patient Stated Goals get back to playing golf    Pain Onset 1 to 4 weeks ago                             [facility] Adult PT Treatment/Exercise - 07/11/20 0001      Shoulder Exercises: Supine   External Rotation AAROM;Right;15 reps    Flexion AAROM;Right;15 reps    ABduction AAROM;Right;15 reps    Other Supine Exercises chest press AAROM 15 reps      Shoulder Exercises: Seated   Other Seated Exercises posterior shoulder rolls X 20 reps, scapular retraction with arms supported X 20 reps      Shoulder Exercises: Standing   External Rotation AAROM;15 reps;Right    External Rotation Limitations using pball on table    Flexion AAROM;Right;15 reps    Flexion Limitations using pball on table    ABduction AAROM;Right;15 reps    ABduction Limitations using pball on table      Shoulder Exercises: Pulleys    Flexion 3 minutes    ABduction 3 minutes      Manual Therapy   Manual therapy comments PROM Rt shoulder gentle all planes, grade 1-2 GH mobs A-P, inferior, distracton                    PT Short Term Goals - 07/11/20 0931      PT SHORT TERM GOAL #1   Title Pt will be I and compliant with initial HEP.    Time 4    Period Weeks    Status Achieved    Target Date 07/30/20      PT SHORT TERM GOAL #2   Title Pt will improve Rt shoulder PROM to WNL.    Time 4    Period Weeks    Status Achieved             PT Long Term Goals - 07/02/20 0929      PT LONG TERM GOAL #1   Title Pt will be I and compliant with final HEP. (target for all goals 12 weeks 09/24/20)    Time 12    Period Weeks    Status New    Target Date 09/24/20  PT LONG TERM GOAL #2   Title Pt will improve Rt shoulder strength to 5/5 MMT to improve function.    Status New      PT LONG TERM GOAL #3   Title Pt will improve Rt shoulder AROM to WNL    Status New      PT LONG TERM GOAL #4   Title Pt will be able to return to normal ADL's and golf with less than 3/10 overall pain in Rt shoulder.    Status New                 Plan - 07/11/20 0930    Clinical Impression Statement ROM is doing very well post op. Continued with AAROM exercises as he is not yet 6 weeks post op. He will see MD next week and will need progress note.    Personal Factors and Comorbidities Age    Examination-Activity Limitations Carry;Sleep;Lift;Reach Overhead    Examination-Participation Restrictions Cleaning;Driving;Laundry;Yard Work    Stability/Clinical Decision Making Stable/Uncomplicated    Rehab Potential Good    PT Frequency 2x / week    PT Duration 12 weeks    PT Treatment/Interventions ADLs/Self Care Home Management;Cryotherapy;Electrical Stimulation;Iontophoresis 4mg /ml Dexamethasone;Moist Heat;Ultrasound;Therapeutic exercise;Neuromuscular re-education;Manual techniques;Dry needling;Passive range of  motion;Taping;Vasopneumatic Device;Joint Manipulations    PT Next Visit Plan AAROM/PR)M until 07/27/20 then can begin strength    PT Home Exercise Plan Access Code: [email or web address]    Consulted and Agree with Plan of Care Patient           Patient will benefit from skilled therapeutic intervention in order to improve the following deficits and impairments:  Decreased activity tolerance, Decreased range of motion, Decreased strength, Hypomobility, Impaired flexibility, Pain  Visit Diagnosis: Acute pain of right shoulder  Muscle weakness (generalized)  Stiffness of right shoulder, not elsewhere classified  Localized edema     Problem List There are no problems to display for this patient.   Kevin Lozano 07/11/2020, 9:32 AM  [facility] Physical Therapy [address] [place], [place], [address] Phone: [phone]   Fax:  [phone]  Name: Kevin Lozano MRN: [id number] Date of Birth: [birth year]

## 2020-07-16 ENCOUNTER — Other Ambulatory Visit: Payer: Self-pay

## 2020-07-16 ENCOUNTER — Ambulatory Visit (INDEPENDENT_AMBULATORY_CARE_PROVIDER_SITE_OTHER): Payer: PPO | Admitting: Physical Therapy

## 2020-07-16 DIAGNOSIS — M25611 Stiffness of right shoulder, not elsewhere classified: Secondary | ICD-10-CM

## 2020-07-16 DIAGNOSIS — M6281 Muscle weakness (generalized): Secondary | ICD-10-CM | POA: Diagnosis not present

## 2020-07-16 DIAGNOSIS — M25511 Pain in right shoulder: Secondary | ICD-10-CM

## 2020-07-16 DIAGNOSIS — R600 Localized edema: Secondary | ICD-10-CM

## 2020-07-16 NOTE — Therapy (Signed)
[facility] Physical Therapy [address] [place], [place], [address] Phone: [phone]   Fax:  [phone]  Physical Therapy Treatment/MD progress note  Patient Details  Name: Kevin Lozano MRN: [id number] Date of Birth: [birth year] Referring Provider (PT): Donella Stade, [place]   Encounter Date: 07/16/2020   PT End of Session - 07/16/20 1034    Visit Number 4    Number of Visits 24    Date for PT Re-Evaluation 09/24/20    Authorization Type healthteam adv    Progress Note Due on Visit 10    PT Start Time 1012    PT Stop Time 1051    PT Time Calculation (min) 39 min    Activity Tolerance Patient tolerated treatment well    Behavior During Therapy [facility] for tasks assessed/performed           No past medical history on file.  No past surgical history on file.  There were no vitals filed for this visit.   Subjective Assessment - 07/16/20 1030    Subjective relays no pain or complaints out of his left shoulder. Says he will see MD tommorow.    Pertinent History none    Limitations House hold activities;Lifting;Sitting    Patient Stated Goals get back to playing golf    Pain Onset 1 to 4 weeks ago              [facility] PT Assessment - 07/16/20 0001      Assessment   Medical Diagnosis S/P Rt RTC repair 06/04/20    Referring Provider (PT) Magnant, Gerrianne Scale, PA-C    Hand Dominance Right    Next MD Visit 07/18/2020      Precautions   Precaution Comments MD precautions to not start RTC strengthening until 07/27/20.       ROM / Strength   AROM / PROM / Strength AROM;PROM;Strength      AROM   AROM Assessment Site Shoulder    Right/Left Shoulder Right    Right Shoulder Flexion 160 Degrees    Right Shoulder ABduction 155 Degrees      PROM   Right Shoulder Flexion 174 Degrees    Right Shoulder ABduction --   WNL   Right Shoulder Internal Rotation 60 Degrees    Right Shoulder External Rotation 85 Degrees      Strength   Overall Strength  Comments did not test due to post op precautions but can at least get 3/5 MMT as he can move through full ROM against gravity                         [facility] Adult PT Treatment/Exercise - 07/16/20 0001      Shoulder Exercises: Sidelying   External Rotation AROM;Right;20 reps      Shoulder Exercises: Standing   Flexion AROM;Both;20 reps    Flexion Limitations cues to avoid shrug    ABduction AROM;Both;20 reps    Extension Both;20 reps    Theraband Level (Shoulder Extension) Level 2 (Red)    Row Both;20 reps    Theraband Level (Shoulder Row) Level 2 (Red)      Shoulder Exercises: ROM/Strengthening   UBE (Upper Arm Bike) no resistance 3 min fwd, 3 min retro    Ranger 15 reps flexion and 15 reps circles in flexion CW and CCW    Wall Wash wall ladder X 5 reps flexion and 5 reps scaption      Shoulder Exercises: Stretch  Corner Stretch Limitations doorway stretch for Rt shoulder ER 10 sec X 10 reps      Manual Therapy   Manual therapy comments PROM Rt shoulder gentle all planes, grade 1-2 GH mobs A-P, inferior, distracton                  PT Education - 07/16/20 1033    Education Details HEP revision to now include AROM    Person(s) Educated Patient    Methods Explanation;Demonstration;Verbal cues;Handout    Comprehension Verbalized understanding;Returned demonstration            PT Short Term Goals - 07/11/20 0931      PT SHORT TERM GOAL #1   Title Pt will be I and compliant with initial HEP.    Time 4    Period Weeks    Status Achieved    Target Date 07/30/20      PT SHORT TERM GOAL #2   Title Pt will improve Rt shoulder PROM to WNL.    Time 4    Period Weeks    Status Achieved             PT Long Term Goals - 07/02/20 0929      PT LONG TERM GOAL #1   Title Pt will be I and compliant with final HEP. (target for all goals 12 weeks 09/24/20)    Time 12    Period Weeks    Status New    Target Date 09/24/20      PT LONG TERM GOAL #2    Title Pt will improve Rt shoulder strength to 5/5 MMT to improve function.    Status New      PT LONG TERM GOAL #3   Title Pt will improve Rt shoulder AROM to WNL    Status New      PT LONG TERM GOAL #4   Title Pt will be able to return to normal ADL's and golf with less than 3/10 overall pain in Rt shoulder.    Status New                 Plan - 07/16/20 1036    Clinical Impression Statement He is now 6 weeks post op so HEP was progressed to now include AROM. He does still have MD recommendations to not begin strengthening 07/27/20. He has met his short term goals. He will follow up with MD tommorow and overall is doing exceptionally well. PT will await to see if MD still wants to hold off on strength or if PT can begin this.    Personal Factors and Comorbidities Age    Examination-Activity Limitations Carry;Sleep;Lift;Reach Overhead    Examination-Participation Restrictions Cleaning;Driving;Laundry;Yard Work    Stability/Clinical Decision Making Stable/Uncomplicated    Rehab Potential Good    PT Frequency 2x / week    PT Duration 12 weeks    PT Treatment/Interventions ADLs/Self Care Home Management;Cryotherapy;Electrical Stimulation;Iontophoresis 29m/ml Dexamethasone;Moist Heat;Ultrasound;Therapeutic exercise;Neuromuscular re-education;Manual techniques;Dry needling;Passive range of motion;Taping;Vasopneumatic Device;Joint Manipulations    PT Next Visit Plan What did MD say can be begin strength or do we still need to wait until 07/27/20?    PT Home Exercise Plan Access Code: [email or web address]   Consulted and Agree with Plan of Care Patient           Patient will benefit from skilled therapeutic intervention in order to improve the following deficits and impairments:  Decreased activity tolerance, Decreased range of motion, Decreased strength, Hypomobility, Impaired flexibility,  Pain  Visit Diagnosis: Acute pain of right shoulder  Muscle weakness (generalized)  Stiffness of right  shoulder, not elsewhere classified  Localized edema     Problem List There are no problems to display for this patient.   [name] 07/16/2020, 11:00 AM  [facility] Physical Therapy [address] [place], [place], [address] Phone: [phone]   Fax:  [phone]  Name: LEVEON PELZER MRN: [id number] Date of Birth: 26-Sep-1946

## 2020-07-16 NOTE — Patient Instructions (Signed)
Access Code: [email or web address] URL: [email or web address] Date: 07/16/2020 Prepared by: Elsie Ra  Exercises Shoulder Flexion Wall Slide with Towel - 2 x daily - 6 x weekly - 2-3 sets - 10 reps Standing Shoulder Abduction Slides at Wall - 2 x daily - 6 x weekly - 2-3 sets - 10 reps Shoulder ER Stretch in Abduction - 2 x daily - 6 x weekly - 10 reps - 10 hold Standing Row with Anchored Resistance - 2 x daily - 6 x weekly - 10-20 reps - 2-3 sets Standing Shoulder Extension with Resistance - 2 x daily - 6 x weekly - 2-3 sets - 10 reps Sidelying Shoulder External Rotation - 2 x daily - 6 x weekly - 2-3 sets - 10 reps

## 2020-07-18 ENCOUNTER — Ambulatory Visit (INDEPENDENT_AMBULATORY_CARE_PROVIDER_SITE_OTHER): Payer: PPO | Admitting: Orthopedic Surgery

## 2020-07-18 DIAGNOSIS — M75101 Unspecified rotator cuff tear or rupture of right shoulder, not specified as traumatic: Secondary | ICD-10-CM

## 2020-07-19 ENCOUNTER — Other Ambulatory Visit: Payer: Self-pay

## 2020-07-19 ENCOUNTER — Ambulatory Visit: Payer: PPO | Admitting: Physical Therapy

## 2020-07-19 DIAGNOSIS — R600 Localized edema: Secondary | ICD-10-CM

## 2020-07-19 DIAGNOSIS — M25611 Stiffness of right shoulder, not elsewhere classified: Secondary | ICD-10-CM | POA: Diagnosis not present

## 2020-07-19 DIAGNOSIS — M25511 Pain in right shoulder: Secondary | ICD-10-CM

## 2020-07-19 DIAGNOSIS — M6281 Muscle weakness (generalized): Secondary | ICD-10-CM | POA: Diagnosis not present

## 2020-07-19 NOTE — Therapy (Signed)
[facility] Physical Therapy [address] [place], [place], [address] Phone: [phone]   Fax:  [phone]  Physical Therapy Treatment  Patient Details  Name: Kevin Lozano MRN: [id number] Date of Birth: 04-25-1946 Referring Provider (PT): Donella Stade, [place]   Encounter Date: 07/19/2020   PT End of Session - 07/19/20 1221    Visit Number 5    Number of Visits 24    Date for PT Re-Evaluation 09/24/20    Authorization Type healthteam adv    Progress Note Due on Visit 10    PT Start Time 1140    PT Stop Time 1220    PT Time Calculation (min) 40 min    Activity Tolerance Patient tolerated treatment well    Behavior During Therapy [facility] for tasks assessed/performed           No past medical history on file.  No past surgical history on file.  There were no vitals filed for this visit.   Subjective Assessment - 07/19/20 1215    Subjective relays MD was pleased and will allow him to begin strengthening now. He denies shoulder pain upon arrival    Pertinent History none    Limitations House hold activities;Lifting;Sitting    Patient Stated Goals get back to playing golf    Pain Onset 1 to 4 weeks ago              [facility] Adult PT Treatment/Exercise - 07/19/20 0001      Therapeutic Activites    Therapeutic Activities Other Therapeutic Activities    Other Therapeutic Activities simulated golf swings, putting, and chipping      Shoulder Exercises: Standing   External Rotation Strengthening;Left    Theraband Level (Shoulder External Rotation) Level 3 (Green)    External Rotation Limitations 2X15    Internal Rotation Strengthening;Left    Theraband Level (Shoulder Internal Rotation) Level 3 (Green)    Internal Rotation Limitations 2X15    Flexion Strengthening;20 reps    Shoulder Flexion Weight (lbs) 2    ABduction Strengthening;Both;20 reps    Shoulder ABduction Weight (lbs) 1    Theraband Level (Shoulder Extension) Level 3 (Green)     Extension Limitations 2X15    Theraband Level (Shoulder Row) Level 3 (Green)    Row Limitations 2X15      Shoulder Exercises: ROM/Strengthening   UBE (Upper Arm Bike) L3, 2.5 min fwd, 2.5 min retro      Shoulder Exercises: Stretch   Corner Stretch Limitations doorway stretch for Rt shoulder ER 10 sec X 10 reps                    PT Short Term Goals - 07/11/20 0931      PT SHORT TERM GOAL #1   Title Pt will be I and compliant with initial HEP.    Time 4    Period Weeks    Status Achieved    Target Date 07/30/20      PT SHORT TERM GOAL #2   Title Pt will improve Rt shoulder PROM to WNL.    Time 4    Period Weeks    Status Achieved             PT Long Term Goals - 07/02/20 0929      PT LONG TERM GOAL #1   Title Pt will be I and compliant with final HEP. (target for all goals 12 weeks 09/24/20)    Time 12  Period Weeks    Status New    Target Date 09/24/20      PT LONG TERM GOAL #2   Title Pt will improve Rt shoulder strength to 5/5 MMT to improve function.    Status New      PT LONG TERM GOAL #3   Title Pt will improve Rt shoulder AROM to WNL    Status New      PT LONG TERM GOAL #4   Title Pt will be able to return to normal ADL's and golf with less than 3/10 overall pain in Rt shoulder.    Status New                 Plan - 07/19/20 1224    Clinical Impression Statement Per patient MD will now allow strengthening and golf swings. This was included in todays session and his HEP was updated to reflect this. Overall continues to do extremely well with PT and will benefit from furhter strengthening.    Personal Factors and Comorbidities Age    Examination-Activity Limitations Carry;Sleep;Lift;Reach Overhead    Examination-Participation Restrictions Cleaning;Driving;Laundry;Yard Work    Stability/Clinical Decision Making Stable/Uncomplicated    Rehab Potential Good    PT Frequency 2x / week    PT Duration 12 weeks    PT Treatment/Interventions  ADLs/Self Care Home Management;Cryotherapy;Electrical Stimulation;Iontophoresis 4mg /ml Dexamethasone;Moist Heat;Ultrasound;Therapeutic exercise;Neuromuscular re-education;Manual techniques;Dry needling;Passive range of motion;Taping;Vasopneumatic Device;Joint Manipulations    PT Next Visit Plan progress strengthening.    PT Home Exercise Plan Access Code: [email or web address]    Consulted and Agree with Plan of Care Patient           Patient will benefit from skilled therapeutic intervention in order to improve the following deficits and impairments:  Decreased activity tolerance, Decreased range of motion, Decreased strength, Hypomobility, Impaired flexibility, Pain  Visit Diagnosis: Acute pain of right shoulder  Muscle weakness (generalized)  Stiffness of right shoulder, not elsewhere classified  Localized edema     Problem List There are no problems to display for this patient.   Debbe Odea, PT,DPT 07/19/2020, 12:27 PM  [facility] Physical Therapy [address] [place], [place], [address] Phone: [phone]   Fax:  [phone]  Name: Kevin Lozano MRN: [id number] Date of Birth: 1946-01-08

## 2020-07-19 NOTE — Patient Instructions (Signed)
Access Code: [email or web address] URL: [email or web address] Date: 07/19/2020 Prepared by: Elsie Ra  Exercises Shoulder ER Stretch in Abduction - 2 x daily - 6 x weekly - 10 reps - 10 hold Standing Row with Anchored Resistance - 2 x daily - 6 x weekly - 10-20 reps - 2-3 sets Standing Shoulder Extension with Resistance - 2 x daily - 6 x weekly - 2-3 sets - 10 reps Shoulder Internal Rotation with Resistance - 2 x daily - 6 x weekly - 10 reps - 2-3 sets Shoulder External Rotation with Anchored Resistance - 2 x daily - 6 x weekly - 10 reps - 2-3 sets Standing Shoulder Flexion to 90 Degrees with Dumbbells - 2 x daily - 6 x weekly - 10 reps - 2 sets Shoulder Abduction with Dumbbells - Palms Down - 2 x daily - 6 x weekly - 10 reps - 2 sets

## 2020-07-20 ENCOUNTER — Encounter: Payer: Self-pay | Admitting: Orthopedic Surgery

## 2020-07-20 NOTE — Progress Notes (Signed)
   Post-Op Visit Note   Patient: Kevin Lozano           Date of Birth: [birth year]           MRN: [id number] Visit Date: 07/18/2020 PCP: Kevin Frees, MD   Assessment & Plan:  Chief Complaint:  Chief Complaint  Patient presents with  . Post-op Follow-up   Visit Diagnoses:  1. Tear of right supraspinatus tendon     Plan: Kevin Lozano is a 74 year old patient who is now about 6 weeks out right shoulder rotator cuff repair.  He is doing home exercise program and physical therapy.  On examination he is got excellent range of motion and good rotator cuff strength.  I think it is okay for him to start strengthening at this time.  Come back in 4 weeks.  Okay for putting and chipping only but no golf swings until I see him back in 4 weeks.  Follow-Up Instructions: Return in about 4 weeks (around 08/15/2020).   Orders:  No orders of the defined types were placed in this encounter.  No orders of the defined types were placed in this encounter.   Imaging: No results found.  PMFS History: There are no problems to display for this patient.  History reviewed. No pertinent past medical history.  History reviewed. No pertinent family history.  History reviewed. No pertinent surgical history. Social History   Occupational History  . Not on file  Tobacco Use  . Smoking status: Former Research scientist (life sciences)  . Smokeless tobacco: Never Used  Substance and Sexual Activity  . Alcohol use: Not on file  . Drug use: Not on file  . Sexual activity: Not on file

## 2020-07-24 ENCOUNTER — Ambulatory Visit: Payer: PPO | Admitting: Physical Therapy

## 2020-07-24 ENCOUNTER — Encounter: Payer: Self-pay | Admitting: Physical Therapy

## 2020-07-24 ENCOUNTER — Other Ambulatory Visit: Payer: Self-pay

## 2020-07-24 DIAGNOSIS — M25611 Stiffness of right shoulder, not elsewhere classified: Secondary | ICD-10-CM | POA: Diagnosis not present

## 2020-07-24 DIAGNOSIS — M25511 Pain in right shoulder: Secondary | ICD-10-CM | POA: Diagnosis not present

## 2020-07-24 DIAGNOSIS — M6281 Muscle weakness (generalized): Secondary | ICD-10-CM

## 2020-07-24 DIAGNOSIS — R600 Localized edema: Secondary | ICD-10-CM

## 2020-07-24 NOTE — Therapy (Signed)
[facility] Physical Therapy [address] [place], [place], [address] Phone: [phone]   Fax:  [phone]  Physical Therapy Treatment  Patient Details  Name: Kevin Lozano MRN: [id number] Date of Birth: 1946/07/08 Referring Provider (PT): Donella Stade, [place]   Encounter Date: 07/24/2020   PT End of Session - 07/24/20 1427    Visit Number 6    Number of Visits 24    Date for PT Re-Evaluation 09/24/20    Authorization Type healthteam adv    Progress Note Due on Visit 10    PT Start Time 3748    PT Stop Time 1055    PT Time Calculation (min) 40 min    Activity Tolerance Patient tolerated treatment well    Behavior During Therapy [facility] for tasks assessed/performed           History reviewed. No pertinent past medical history.  History reviewed. No pertinent surgical history.  There were no vitals filed for this visit.   Subjective Assessment - 07/24/20 1015    Subjective doing well; wasn't able to work on hitting golf balls over the weekend    Pertinent History none    Limitations House hold activities;Lifting;Sitting    Patient Stated Goals get back to playing golf    Currently in Pain? No/denies    Pain Onset --                             [facility] Adult PT Treatment/Exercise - 07/24/20 1018      Shoulder Exercises: Sidelying   External Rotation Strengthening;Right;Weights   3x10   External Rotation Weight (lbs) 2    ABduction Right   3x10   ABduction Weight (lbs) 2      Shoulder Exercises: Standing   Horizontal ABduction Both;Weights   3x10   Horizontal ABduction Weight (lbs) 1    Internal Rotation Strengthening;Left   3x10   Theraband Level (Shoulder Internal Rotation) Level 4 (Blue)    Flexion Both   3x10   Shoulder Flexion Weight (lbs) 2    ABduction Both;Weights   3x10   Shoulder ABduction Weight (lbs) 2    Extension Both   3x10   Theraband Level (Shoulder Extension) Level 4 (Blue)    Row Both   3x10    Theraband Level (Shoulder Row) Level 4 (Blue)      Shoulder Exercises: ROM/Strengthening   UBE (Upper Arm Bike) L4 x 6 min (3' each direction)    Proximal Shoulder Strengthening, Supine 2# ball; circles x 20 each direction; A-z upper/lower case    Rhythmic Stabilization, Supine Rt with 2# ball; all directions 3x30 sec                    PT Short Term Goals - 07/11/20 0931      PT SHORT TERM GOAL #1   Title Pt will be I and compliant with initial HEP.    Time 4    Period Weeks    Status Achieved    Target Date 07/30/20      PT SHORT TERM GOAL #2   Title Pt will improve Rt shoulder PROM to WNL.    Time 4    Period Weeks    Status Achieved             PT Long Term Goals - 07/02/20 0929      PT LONG TERM GOAL #1   Title Pt will  be I and compliant with final HEP. (target for all goals 12 weeks 09/24/20)    Time 12    Period Weeks    Status New    Target Date 09/24/20      PT LONG TERM GOAL #2   Title Pt will improve Rt shoulder strength to 5/5 MMT to improve function.    Status New      PT LONG TERM GOAL #3   Title Pt will improve Rt shoulder AROM to WNL    Status New      PT LONG TERM GOAL #4   Title Pt will be able to return to normal ADL's and golf with less than 3/10 overall pain in Rt shoulder.    Status New                 Plan - 07/24/20 1428    Clinical Impression Statement Pt tolerated session well with progression of strengthening to Rt shoulder.  Overall pt tolerated session well without increase in pain.  Will continue to benefit from PT to maximize function.    Personal Factors and Comorbidities Age    Examination-Activity Limitations Carry;Sleep;Lift;Reach Overhead    Examination-Participation Restrictions Cleaning;Driving;Laundry;Yard Work    Stability/Clinical Decision Making Stable/Uncomplicated    Rehab Potential Good    PT Frequency 2x / week    PT Duration 12 weeks    PT Treatment/Interventions ADLs/Self Care Home  Management;Cryotherapy;Electrical Stimulation;Iontophoresis 4mg /ml Dexamethasone;Moist Heat;Ultrasound;Therapeutic exercise;Neuromuscular re-education;Manual techniques;Dry needling;Passive range of motion;Taping;Vasopneumatic Device;Joint Manipulations    PT Next Visit Plan progress strengthening; continue as able    PT Home Exercise Plan Access Code: [email or web address]    Consulted and Agree with Plan of Care Patient           Patient will benefit from skilled therapeutic intervention in order to improve the following deficits and impairments:  Decreased activity tolerance, Decreased range of motion, Decreased strength, Hypomobility, Impaired flexibility, Pain  Visit Diagnosis: Acute pain of right shoulder  Muscle weakness (generalized)  Stiffness of right shoulder, not elsewhere classified  Localized edema     Problem List There are no problems to display for this patient.     Laureen Abrahams, PT, DPT 07/24/20 2:30 PM     [facility] Physical Therapy [address] [place], [place], [address] Phone: [phone]   Fax:  [phone]  Name: KAHMARI KOLLER MRN: [id number] Date of Birth: 1946/08/06

## 2020-07-26 ENCOUNTER — Other Ambulatory Visit: Payer: Self-pay

## 2020-07-26 ENCOUNTER — Encounter: Payer: Self-pay | Admitting: Physical Therapy

## 2020-07-26 ENCOUNTER — Ambulatory Visit (INDEPENDENT_AMBULATORY_CARE_PROVIDER_SITE_OTHER): Payer: PPO | Admitting: Physical Therapy

## 2020-07-26 DIAGNOSIS — M25511 Pain in right shoulder: Secondary | ICD-10-CM | POA: Diagnosis not present

## 2020-07-26 DIAGNOSIS — M25611 Stiffness of right shoulder, not elsewhere classified: Secondary | ICD-10-CM

## 2020-07-26 DIAGNOSIS — R600 Localized edema: Secondary | ICD-10-CM | POA: Diagnosis not present

## 2020-07-26 DIAGNOSIS — M6281 Muscle weakness (generalized): Secondary | ICD-10-CM | POA: Diagnosis not present

## 2020-07-26 NOTE — Patient Instructions (Signed)
Access Code: [email or web address] URL: [email or web address] Date: 07/26/2020 Prepared by: Elsie Ra  Exercises Shoulder ER Stretch in Abduction - 2 x daily - 6 x weekly - 10 reps - 10 hold Standing Row with Anchored Resistance - 2 x daily - 6 x weekly - 10-20 reps - 2-3 sets Standing Shoulder Extension with Resistance - 2 x daily - 6 x weekly - 2-3 sets - 10 reps Shoulder Internal Rotation with Resistance - 2 x daily - 6 x weekly - 10 reps - 2-3 sets Shoulder External Rotation with Anchored Resistance - 2 x daily - 6 x weekly - 10 reps - 2-3 sets Standing Shoulder Flexion to 90 Degrees with Dumbbells - 2 x daily - 6 x weekly - 10 reps - 2 sets Shoulder Abduction with Dumbbells - Palms Down - 2 x daily - 6 x weekly - 10 reps - 2 sets Push-Up on Counter - 2 x daily - 6 x weekly - 3 sets - 10 reps Standing Shoulder Horizontal Abduction with Resistance - 2 x daily - 6 x weekly - 2 sets - 15 reps

## 2020-07-26 NOTE — Therapy (Signed)
[facility] Physical Therapy [address] [place], [place], [address] Phone: [phone]   Fax:  [phone]  Physical Therapy Treatment  Patient Details  Name: Kevin Lozano MRN: [id number] Date of Birth: August 03, 1946 Referring Provider (PT): Donella Stade, [place]   Encounter Date: 07/26/2020   PT End of Session - 07/26/20 1000    Visit Number 7    Number of Visits 24    Date for PT Re-Evaluation 09/24/20    Authorization Type healthteam adv    Progress Note Due on Visit 10    PT Start Time 0930    PT Stop Time 1010    PT Time Calculation (min) 40 min    Activity Tolerance Patient tolerated treatment well    Behavior During Therapy [facility] for tasks assessed/performed           History reviewed. No pertinent past medical history.  History reviewed. No pertinent surgical history.  There were no vitals filed for this visit.   Subjective Assessment - 07/26/20 0951    Subjective no pain upon arrival, occasional pain at times but not bad    Pertinent History none    Limitations House hold activities;Lifting;Sitting    Patient Stated Goals get back to playing golf    Pain Onset 1 to 4 weeks ago             [facility] Adult PT Treatment/Exercise - 07/26/20 0001      Shoulder Exercises: Standing   Horizontal ABduction 20 reps    Theraband Level (Shoulder Horizontal ABduction) Level 4 (Blue)    Internal Rotation Right    Theraband Level (Shoulder Internal Rotation) Level 4 (Blue)    Internal Rotation Limitations 2X20    Flexion Both    Shoulder Flexion Weight (lbs) 2    Flexion Limitations 2X15    ABduction Both    Shoulder ABduction Weight (lbs) 2    ABduction Limitations 2X15    Extension Both    Theraband Level (Shoulder Extension) Level 4 (Blue)    Extension Limitations 2X15    Row Both    Theraband Level (Shoulder Row) Level 4 (Blue)    Row Limitations 30      Shoulder Exercises: ROM/Strengthening   UBE (Upper Arm Bike) L6 6 min 3 fwd, 3  retro    Lat Pull Limitations 35 lbs 2X15    Cybex Press Limitations 20 lbs 2X15    Cybex Row Limitations 35 lbs 2X15                  PT Education - 07/26/20 1000    Education Details HEP progression for more strengthening, he was also given blue Tband    Person(s) Educated Patient    Methods Explanation;Demonstration;Verbal cues;Handout    Comprehension Verbalized understanding;Returned demonstration            PT Short Term Goals - 07/11/20 0931      PT SHORT TERM GOAL #1   Title Pt will be I and compliant with initial HEP.    Time 4    Period Weeks    Status Achieved    Target Date 07/30/20      PT SHORT TERM GOAL #2   Title Pt will improve Rt shoulder PROM to WNL.    Time 4    Period Weeks    Status Achieved             PT Long Term Goals - 07/02/20 6144  PT LONG TERM GOAL #1   Title Pt will be I and compliant with final HEP. (target for all goals 12 weeks 09/24/20)    Time 12    Period Weeks    Status New    Target Date 09/24/20      PT LONG TERM GOAL #2   Title Pt will improve Rt shoulder strength to 5/5 MMT to improve function.    Status New      PT LONG TERM GOAL #3   Title Pt will improve Rt shoulder AROM to WNL    Status New      PT LONG TERM GOAL #4   Title Pt will be able to return to normal ADL's and golf with less than 3/10 overall pain in Rt shoulder.    Status New                 Plan - 07/26/20 1004    Clinical Impression Statement He continues to do exceptionally well with PT. He was again able to progress strenght program without complaints. His HEP was updated today for strength progression at home and he was given blue Tband to take home. Continue POC    Personal Factors and Comorbidities Age    Examination-Activity Limitations Carry;Sleep;Lift;Reach Overhead    Examination-Participation Restrictions Cleaning;Driving;Laundry;Yard Work    Stability/Clinical Decision Making Stable/Uncomplicated    Rehab Potential  Good    PT Frequency 2x / week    PT Duration 12 weeks    PT Treatment/Interventions ADLs/Self Care Home Management;Cryotherapy;Electrical Stimulation;Iontophoresis 4mg /ml Dexamethasone;Moist Heat;Ultrasound;Therapeutic exercise;Neuromuscular re-education;Manual techniques;Dry needling;Passive range of motion;Taping;Vasopneumatic Device;Joint Manipulations    PT Next Visit Plan progress strengthening as able, goal is to get back to golf    PT Home Exercise Plan Access Code: [email or web address] and Agree with Plan of Care Patient           Patient will benefit from skilled therapeutic intervention in order to improve the following deficits and impairments:  Decreased activity tolerance, Decreased range of motion, Decreased strength, Hypomobility, Impaired flexibility, Pain  Visit Diagnosis: Acute pain of right shoulder  Muscle weakness (generalized)  Stiffness of right shoulder, not elsewhere classified  Localized edema     Problem List There are no problems to display for this patient.   [name] 07/26/2020, 10:13 AM  [facility] Physical Therapy [address] [place], [place], [address] Phone: [phone]   Fax:  [phone]  Name: Kevin Lozano MRN: [id number] Date of Birth: 1946-06-30

## 2020-07-27 ENCOUNTER — Ambulatory Visit: Payer: PPO | Admitting: Physical Therapy

## 2020-07-31 ENCOUNTER — Ambulatory Visit: Payer: PPO | Admitting: Physical Therapy

## 2020-07-31 ENCOUNTER — Other Ambulatory Visit: Payer: Self-pay

## 2020-07-31 DIAGNOSIS — M25611 Stiffness of right shoulder, not elsewhere classified: Secondary | ICD-10-CM

## 2020-07-31 DIAGNOSIS — M25511 Pain in right shoulder: Secondary | ICD-10-CM

## 2020-07-31 DIAGNOSIS — R600 Localized edema: Secondary | ICD-10-CM | POA: Diagnosis not present

## 2020-07-31 DIAGNOSIS — M6281 Muscle weakness (generalized): Secondary | ICD-10-CM | POA: Diagnosis not present

## 2020-07-31 NOTE — Therapy (Signed)
[facility] Physical Therapy [address] [place], [place], [address] Phone: [phone]   Fax:  [phone]  Physical Therapy Treatment  Patient Details  Name: Kevin Lozano MRN: [id number] Date of Birth: 10/15/1946 Referring Provider (PT): Donella Stade, [place]   Encounter Date: 07/31/2020   PT End of Session - 07/31/20 0958    Visit Number 8    Number of Visits 24    Date for PT Re-Evaluation 09/24/20    Authorization Type healthteam adv    Progress Note Due on Visit 10    PT Start Time 0925    PT Stop Time 1005    PT Time Calculation (min) 40 min    Activity Tolerance Patient tolerated treatment well    Behavior During Therapy [facility] for tasks assessed/performed           No past medical history on file.  No past surgical history on file.  There were no vitals filed for this visit.   Subjective Assessment - 07/31/20 0957    Subjective no pain upon arrival, a littte tired from increased activity. No pain swinging golf club    Pertinent History none    Limitations House hold activities;Lifting;Sitting    Patient Stated Goals get back to playing golf    Pain Onset 1 to 4 weeks ago           [facility] Adult PT Treatment/Exercise - 07/31/20 0001      Shoulder Exercises: Standing   Theraband Level (Shoulder Horizontal ABduction) Level 4 (Blue)    Horizontal ABduction Limitations 2X15    External Rotation Right    Theraband Level (Shoulder External Rotation) Level 4 (Blue)    External Rotation Weight (lbs) 2X15 at 90/90. Also performed bilat ER at same time from neutral position blue 2X15    Internal Rotation Right    Theraband Level (Shoulder Internal Rotation) Level 4 (Blue)    Internal Rotation Limitations 2X15, at 90/90 deg    Flexion Both    Shoulder Flexion Weight (lbs) 3    Flexion Limitations 2X15    ABduction Both    Shoulder ABduction Weight (lbs) 3    ABduction Limitations 2X15    Extension Both    Theraband Level (Shoulder  Extension) Level 4 (Blue)    Extension Limitations 2X15    Row Both    Theraband Level (Shoulder Row) Level 4 (Blue)    Row Limitations 30      Shoulder Exercises: ROM/Strengthening   UBE (Upper Arm Bike) L6 6 min 3 fwd, 3 retro    Lat Pull Limitations 35 lbs 2X15    Cybex Press Limitations 20 lbs 2X15    Cybex Row Limitations 45 lbs 2X15      Shoulder Exercises: Body Blade   Flexion --    ABduction 3 reps;30 seconds    External Rotation 30 seconds;1 rep                    PT Short Term Goals - 07/11/20 0931      PT SHORT TERM GOAL #1   Title Pt will be I and compliant with initial HEP.    Time 4    Period Weeks    Status Achieved    Target Date 07/30/20      PT SHORT TERM GOAL #2   Title Pt will improve Rt shoulder PROM to WNL.    Time 4    Period Weeks    Status Achieved  PT Long Term Goals - 07/31/20 1009      PT LONG TERM GOAL #1   Title Pt will be I and compliant with final HEP. (target for all goals 12 weeks 09/24/20)    Baseline met for initial HEP    Time 12    Period Weeks    Status On-going      PT LONG TERM GOAL #2   Title Pt will improve Rt shoulder strength to 5/5 MMT to improve function.    Baseline now 4+    Status On-going      PT LONG TERM GOAL #3   Title Pt will improve Rt shoulder AROM to WNL    Baseline now met    Status Achieved      PT LONG TERM GOAL #4   Title Pt will be able to return to normal ADL's and golf with less than 3/10 overall pain in Rt shoulder.    Baseline not allowed to return to golf until labor day    Status On-going                 Plan - 07/31/20 1003    Clinical Impression Statement Continued with strength progression today with good tolerance and without complaints. He was able to show improved strength and stability with the exercises. Continue POC    Personal Factors and Comorbidities Age    Examination-Activity Limitations Carry;Sleep;Lift;Reach Overhead     Examination-Participation Restrictions Cleaning;Driving;Laundry;Yard Work    Stability/Clinical Decision Making Stable/Uncomplicated    Rehab Potential Good    PT Frequency 2x / week    PT Duration 12 weeks    PT Treatment/Interventions ADLs/Self Care Home Management;Cryotherapy;Electrical Stimulation;Iontophoresis 66m/ml Dexamethasone;Moist Heat;Ultrasound;Therapeutic exercise;Neuromuscular re-education;Manual techniques;Dry needling;Passive range of motion;Taping;Vasopneumatic Device;Joint Manipulations    PT Next Visit Plan progress strengthening as able, goal is to get back to golf    PT Home Exercise Plan Access Code: [place] Agree with Plan of Care Patient           Patient will benefit from skilled therapeutic intervention in order to improve the following deficits and impairments:  Decreased activity tolerance, Decreased range of motion, Decreased strength, Hypomobility, Impaired flexibility, Pain  Visit Diagnosis: Acute pain of right shoulder  Muscle weakness (generalized)  Stiffness of right shoulder, not elsewhere classified  Localized edema     Problem List There are no problems to display for this patient.   BDebbe Odea,PT,DPT 07/31/2020, 10:10 AM  [facility] [address] [address]Phone: [phone]  Fax:  [phone] Name: Kevin GILLERMRN: [id number] of Birth: 1Jan 21, 1947

## 2020-08-02 ENCOUNTER — Ambulatory Visit: Payer: PPO | Admitting: Physical Therapy

## 2020-08-02 ENCOUNTER — Other Ambulatory Visit: Payer: Self-pay

## 2020-08-02 DIAGNOSIS — M25611 Stiffness of right shoulder, not elsewhere classified: Secondary | ICD-10-CM | POA: Diagnosis not present

## 2020-08-02 DIAGNOSIS — M6281 Muscle weakness (generalized): Secondary | ICD-10-CM

## 2020-08-02 DIAGNOSIS — R600 Localized edema: Secondary | ICD-10-CM

## 2020-08-02 DIAGNOSIS — M25511 Pain in right shoulder: Secondary | ICD-10-CM

## 2020-08-02 NOTE — Therapy (Signed)
[facility] Physical Therapy [address] [place], [place], [address] Phone: [phone]   Fax:  [phone]  Physical Therapy Treatment  Patient Details  Name: Kevin Lozano MRN: [id number] Date of Birth: [birth year] Referring Provider (PT): Donella Stade, [place]   Encounter Date: 08/02/2020   PT End of Session - 08/02/20 1054    Visit Number 9    Number of Visits 24    Date for PT Re-Evaluation 09/24/20    Authorization Type healthteam adv    Progress Note Due on Visit 10    PT Start Time 0930    PT Stop Time 1012    PT Time Calculation (min) 42 min    Activity Tolerance Patient tolerated treatment well    Behavior During Therapy [facility] for tasks assessed/performed           No past medical history on file.  No past surgical history on file.  There were no vitals filed for this visit.   Subjective Assessment - 08/02/20 0950    Subjective no pain to report    Pertinent History none    Limitations House hold activities;Lifting;Sitting    Patient Stated Goals get back to playing golf    Currently in Pain? No/denies    Pain Onset 1 to 4 weeks ago                             [facility] Adult PT Treatment/Exercise - 08/02/20 0001      Shoulder Exercises: Standing   External Rotation Right    External Rotation Limitations 3X10 with 5 lb cable    Internal Rotation Right    Extension Right    Extension Limitations 3X10 with 10 lb cable    Row Right    Row Limitations 3X10 with 25 lb cable    Other Standing Exercises resisted golf swing motion on cable machine 25 lbs 3X15 for down swing,  10 lbs 3X15 or backswing.     Other Standing Exercises Rt shoulder abd on cable machine 5 lbs 3X10, cable curl and press 5 lbs X 10 reps      Shoulder Exercises: ROM/Strengthening   UBE (Upper Arm Bike) L6 6 min 3 fwd, 3 retro    Lat Pull Limitations 45 lbs 2X15    Cybex Press Limitations 35 lbs 3X10    Cybex Row Limitations 45 lbs 2X15       Shoulder Exercises: Body Blade   Flexion 30 seconds;1 rep    ABduction 3 reps;30 seconds    External Rotation 30 seconds;1 rep                    PT Short Term Goals - 07/11/20 0931      PT SHORT TERM GOAL #1   Title Pt will be I and compliant with initial HEP.    Time 4    Period Weeks    Status Achieved    Target Date 07/30/20      PT SHORT TERM GOAL #2   Title Pt will improve Rt shoulder PROM to WNL.    Time 4    Period Weeks    Status Achieved             PT Long Term Goals - 07/31/20 1009      PT LONG TERM GOAL #1   Title Pt will be I and compliant with final HEP. (target for all goals 12 weeks  09/24/20)    Baseline met for initial HEP    Time 12    Period Weeks    Status On-going      PT LONG TERM GOAL #2   Title Pt will improve Rt shoulder strength to 5/5 MMT to improve function.    Baseline now 4+    Status On-going      PT LONG TERM GOAL #3   Title Pt will improve Rt shoulder AROM to WNL    Baseline now met    Status Achieved      PT LONG TERM GOAL #4   Title Pt will be able to return to normal ADL's and golf with less than 3/10 overall pain in Rt shoulder.    Baseline not allowed to return to golf until labor day    Status On-going                 Plan - 08/02/20 1055    Clinical Impression Statement Session focused on showing him different strengthening exercises to do on weight machines that he can start doing at gym. He shows good understanding of this and uses proper form.    Personal Factors and Comorbidities Age    Examination-Activity Limitations Carry;Sleep;Lift;Reach Overhead    Examination-Participation Restrictions Cleaning;Driving;Laundry;Yard Work    Stability/Clinical Decision Making Stable/Uncomplicated    Rehab Potential Good    PT Frequency 2x / week    PT Duration 12 weeks    PT Treatment/Interventions ADLs/Self Care Home Management;Cryotherapy;Electrical Stimulation;Iontophoresis 33m/ml Dexamethasone;Moist  Heat;Ultrasound;Therapeutic exercise;Neuromuscular re-education;Manual techniques;Dry needling;Passive range of motion;Taping;Vasopneumatic Device;Joint Manipulations    PT Next Visit Plan progress strengthening as able, goal is to get back to golf    PT Home Exercise Plan Access Code: [email or web address] Agree with Plan of Care Patient           Patient will benefit from skilled therapeutic intervention in order to improve the following deficits and impairments:  Decreased activity tolerance, Decreased range of motion, Decreased strength, Hypomobility, Impaired flexibility, Pain  Visit Diagnosis: Acute pain of right shoulder  Muscle weakness (generalized)  Stiffness of right shoulder, not elsewhere classified  Localized edema     Problem List There are no problems to display for this patient.   BDebbe Odea PT,DPT 08/02/2020, 1[facility] [address] [address]Phone: [phone]  Fax:  [phone] Name: Kevin SHANKELMRN: [id number] of Birth: [birth year]

## 2020-08-08 ENCOUNTER — Ambulatory Visit: Payer: PPO | Admitting: Physical Therapy

## 2020-08-08 ENCOUNTER — Other Ambulatory Visit: Payer: Self-pay

## 2020-08-08 DIAGNOSIS — M6281 Muscle weakness (generalized): Secondary | ICD-10-CM

## 2020-08-08 DIAGNOSIS — M25511 Pain in right shoulder: Secondary | ICD-10-CM

## 2020-08-08 DIAGNOSIS — R600 Localized edema: Secondary | ICD-10-CM | POA: Diagnosis not present

## 2020-08-08 DIAGNOSIS — M25611 Stiffness of right shoulder, not elsewhere classified: Secondary | ICD-10-CM | POA: Diagnosis not present

## 2020-08-08 NOTE — Therapy (Signed)
[facility] Physical Therapy [address] [place], [place], [address] Phone: [phone]   Fax:  [phone]  Physical Therapy Treatment/Progress note Progress Note reporting period 07/02/20  to 08/08/20  See below for objective and subjective measurements relating to patients progress with PT.   Patient Details  Name: Kevin Lozano MRN: [id number] Date of Birth: 11-Aug-1946 Referring Provider (PT): Julieanne Cotton, [place]   Encounter Date: 08/08/2020   PT End of Session - 08/08/20 0954    Visit Number 10    Number of Visits 24    Date for PT Re-Evaluation 09/24/20    Authorization Type healthteam adv    Progress Note Due on Visit 20    PT Start Time 0930    PT Stop Time 1010    PT Time Calculation (min) 40 min    Activity Tolerance Patient tolerated treatment well    Behavior During Therapy [facility] for tasks assessed/performed           No past medical history on file.  No past surgical history on file.  There were no vitals filed for this visit.   Subjective Assessment - 08/08/20 0949    Subjective no pain just soreness in his shoulder from sanding.    Pertinent History none    Limitations House hold activities;Lifting;Sitting    Patient Stated Goals get back to playing golf    Pain Onset 1 to 4 weeks ago              [facility] PT Assessment - 08/08/20 0001      Assessment   Medical Diagnosis S/P Rt RTC repair 06/04/20    Referring Provider (PT) Magnant, Arlyss Gandy    Next MD Visit 08/20/20      AROM   Right Shoulder Flexion 165 Degrees    Right Shoulder ABduction 160 Degrees    Right Shoulder Internal Rotation --   [facility]   Right Shoulder External Rotation --   [facility]     Strength   Strength Assessment Site Shoulder    Right/Left Shoulder Right    Right Shoulder Flexion 4/5    Right Shoulder ABduction 4/5    Right Shoulder Internal Rotation 5/5    Right Shoulder External Rotation 4+/5                         [facility]  Adult PT Treatment/Exercise - 08/08/20 0001      Shoulder Exercises: Standing   External Rotation Right    External Rotation Limitations 3X15 with 5 lb cable (inc wt next time)    Internal Rotation Right    Internal Rotation Limitations 2X18 with 10 lbs    Flexion Both    Shoulder Flexion Weight (lbs) 3    Flexion Limitations 2X15    ABduction Both    Shoulder ABduction Weight (lbs) 3    ABduction Limitations 2X15    Extension Right    Extension Limitations 3X10 with 15 lb cable    Other Standing Exercises resisted golf swing motion on cable machine 25 lbs 3X15 for down swing,  10 lbs 3X15 or backswing.       Shoulder Exercises: ROM/Strengthening   UBE (Upper Arm Bike) L6 6 min 3 fwd, 3 retro    Lat Pull Limitations 45 lbs 2X15    Cybex Press Limitations 35 lbs 3X10    Cybex Row Limitations 45 lbs 2X15      Shoulder Exercises: Stretch   Other  Shoulder Stretches doorway stretch for flexion 10 sec X 10, supine flexion stretch with 3 lbs 5 sec X 10                    PT Short Term Goals - 07/11/20 0931      PT SHORT TERM GOAL #1   Title Pt will be I and compliant with initial HEP.    Time 4    Period Weeks    Status Achieved    Target Date 07/30/20      PT SHORT TERM GOAL #2   Title Pt will improve Rt shoulder PROM to WNL.    Time 4    Period Weeks    Status Achieved             PT Long Term Goals - 08/08/20 1001      PT LONG TERM GOAL #1   Title Pt will be I and compliant with final HEP. (target for all goals 12 weeks 09/24/20)    Baseline met for initial HEP    Time 12    Period Weeks    Status On-going      PT LONG TERM GOAL #2   Title Pt will improve Rt shoulder strength to 5/5 MMT to improve function.    Baseline now 4+    Status On-going      PT LONG TERM GOAL #3   Title Pt will improve Rt shoulder AROM to WNL    Baseline now met    Status Achieved      PT LONG TERM GOAL #4   Title Pt will be able to return to normal ADL's and golf with  less than 3/10 overall pain in Rt shoulder.    Baseline not allowed to return to golf until labor day    Status On-going                 Plan - 08/08/20 0956    Clinical Impression Statement Progress note and updated measurements reflect that he is making excellent progress with PT. His ROM and strength are overall doing well and only mild defiicits in these areas and he is no longer having pain. PT recommeding continueing PT for 3 more weeks to work on strength and return to golf.    Personal Factors and Comorbidities Age    Examination-Activity Limitations Carry;Sleep;Lift;Reach Overhead    Examination-Participation Restrictions Cleaning;Driving;Laundry;Yard Work    Stability/Clinical Decision Making Stable/Uncomplicated    Rehab Potential Good    PT Frequency 2x / week    PT Duration 12 weeks    PT Treatment/Interventions ADLs/Self Care Home Management;Cryotherapy;Electrical Stimulation;Iontophoresis 4mg /ml Dexamethasone;Moist Heat;Ultrasound;Therapeutic exercise;Neuromuscular re-education;Manual techniques;Dry needling;Passive range of motion;Taping;Vasopneumatic Device;Joint Manipulations    PT Next Visit Plan progress strengthening as able, goal is to get back to golf    PT Home Exercise Plan Access Code: [place] and Agree with Plan of Care Patient           Patient will benefit from skilled therapeutic intervention in order to improve the following deficits and impairments:  Decreased activity tolerance, Decreased range of motion, Decreased strength, Hypomobility, Impaired flexibility, Pain  Visit Diagnosis: Acute pain of right shoulder  Muscle weakness (generalized)  Stiffness of right shoulder, not elsewhere classified  Localized edema     Problem List There are no problems to display for this patient.   Debbe Odea, PT,DPT 08/08/2020, 10:10 AM  [facility] Physical Therapy [phone]  [place], [place],  [address] Phone: [phone]   Fax:  [phone]  Name: Kevin Lozano MRN: [id number] Date of Birth: [birth year]

## 2020-08-10 ENCOUNTER — Other Ambulatory Visit: Payer: Self-pay

## 2020-08-10 ENCOUNTER — Ambulatory Visit (INDEPENDENT_AMBULATORY_CARE_PROVIDER_SITE_OTHER): Payer: PPO | Admitting: Physical Therapy

## 2020-08-10 DIAGNOSIS — M25511 Pain in right shoulder: Secondary | ICD-10-CM | POA: Diagnosis not present

## 2020-08-10 DIAGNOSIS — M6281 Muscle weakness (generalized): Secondary | ICD-10-CM | POA: Diagnosis not present

## 2020-08-10 DIAGNOSIS — M25611 Stiffness of right shoulder, not elsewhere classified: Secondary | ICD-10-CM

## 2020-08-10 NOTE — Therapy (Signed)
[facility] Physical Therapy [address] [place], [place], [address] Phone: [phone]   Fax:  [phone]  Physical Therapy Treatment/Discharge summary PHYSICAL THERAPY DISCHARGE SUMMARY  Visits from Start of Care: 11  Current functional level related to goals / functional outcomes: See below   Remaining deficits: See below   Education / Equipment: HEP Plan: Patient agrees to discharge.  Patient goals were met. Patient is being discharged due to being pleased with the current functional level.  ?????       Patient Details  Name: Kevin Lozano MRN: [id number] Date of Birth: Mar 19, 1946 Referring Provider (PT): Julieanne Cotton, [place]   Encounter Date: 08/10/2020   PT End of Session - 08/10/20 0946    Visit Number 11    Number of Visits 24    Date for PT Re-Evaluation 09/24/20    Authorization Type healthteam adv    Progress Note Due on Visit 20    PT Start Time 0845    PT Stop Time 0930    PT Time Calculation (min) 45 min    Activity Tolerance Patient tolerated treatment well    Behavior During Therapy [facility] for tasks assessed/performed           No past medical history on file.  No past surgical history on file.  There were no vitals filed for this visit.   Subjective Assessment - 08/10/20 0946    Subjective no pain or complaints, he feels ready to discharge    Pertinent History none    Limitations House hold activities;Lifting;Sitting    Patient Stated Goals get back to playing golf    Pain Onset 1 to 4 weeks ago              [facility] PT Assessment - 08/10/20 0001      Assessment   Medical Diagnosis S/P Rt RTC repair 06/04/20    Referring Provider (PT) Magnant, Charles L, PA-C      AROM   Overall AROM Comments WFL all planes for Rt shoulder      Strength   Right Shoulder Flexion 4+/5    Right Shoulder ABduction 4+/5    Right Shoulder Internal Rotation 5/5    Right Shoulder External Rotation 4+/5                          [facility] Adult PT Treatment/Exercise - 08/10/20 0001      Shoulder Exercises: Standing   External Rotation Right    External Rotation Limitations 2X20 with 5 lb cable (inc wt next time)    Internal Rotation Right    Internal Rotation Limitations 2X18 with 10 lbs    Flexion Both    Shoulder Flexion Weight (lbs) 5    Flexion Limitations 2X15    ABduction Both    Shoulder ABduction Weight (lbs) 5    ABduction Limitations 2X10    Extension Right    Extension Limitations 3X10 with 15 lb cable    Other Standing Exercises resisted golf swing motion on cable machine 25 lbs 3X15 for down swing,  10 lbs 3X15 or backswing.     Other Standing Exercises Rt shoulder cable curl and press 5 lbs2 X 10 reps      Shoulder Exercises: ROM/Strengthening   UBE (Upper Arm Bike) L6 6 min 3 fwd, 3 retro    Lat Pull Limitations 55 lbs 310    Cybex Press Limitations 45 lbs 3X10    Cybex Row  Limitations 55 lbs 2X15    Other ROM/Strengthening Exercises simulated golf swings hitting foam balls in clinic      Shoulder Exercises: Stretch   Other Shoulder Stretches doorway stretch for flexion 10 sec X 10, supine flexion stretch with 3 lbs 5 sec X 10      Shoulder Exercises: Body Blade   Flexion 30 seconds;2 reps    ABduction 3 reps;30 seconds    External Rotation 30 seconds;1 rep                    PT Short Term Goals - 07/11/20 0931      PT SHORT TERM GOAL #1   Title Pt will be I and compliant with initial HEP.    Time 4    Period Weeks    Status Achieved    Target Date 07/30/20      PT SHORT TERM GOAL #2   Title Pt will improve Rt shoulder PROM to WNL.    Time 4    Period Weeks    Status Achieved             PT Long Term Goals - 08/10/20 0959      PT LONG TERM GOAL #1   Title Pt will be I and compliant with final HEP. (target for all goals 12 weeks 09/24/20)    Baseline met for initial HEP    Time 12    Period Weeks    Status Achieved      PT LONG  TERM GOAL #2   Title Pt will improve Rt shoulder strength to 5/5 MMT to improve function.    Baseline now 4+    Status Partially Met      PT LONG TERM GOAL #3   Title Pt will improve Rt shoulder AROM to WNL    Baseline now met    Status Achieved      PT LONG TERM GOAL #4   Title Pt will be able to return to normal ADL's and golf with less than 3/10 overall pain in Rt shoulder.    Baseline not allowed to return to golf until labor day but able to simulate full golf swings hitting foam balls without pain or difficulty    Status Achieved                 Plan - 08/10/20 0948    Clinical Impression Statement He has done exceptionally well with PT. He only has very mild weakness in his Rt shoulder but no other deficits and he feels ready to discharge. PT is satisfied with where he is functionally and will discharge today. He had no further questions or concerns.    Personal Factors and Comorbidities Age    Examination-Activity Limitations Carry;Sleep;Lift;Reach Overhead    Examination-Participation Restrictions Cleaning;Driving;Laundry;Yard Work    Stability/Clinical Decision Making Stable/Uncomplicated    Rehab Potential Good    PT Frequency 2x / week    PT Duration 12 weeks    PT Treatment/Interventions ADLs/Self Care Home Management;Cryotherapy;Electrical Stimulation;Iontophoresis 4mg /ml Dexamethasone;Moist Heat;Ultrasound;Therapeutic exercise;Neuromuscular re-education;Manual techniques;Dry needling;Passive range of motion;Taping;Vasopneumatic Device;Joint Manipulations    PT Next Visit Plan DC today    PT Home Exercise Plan Access Code: [email or web address]    Consulted and Agree with Plan of Care Patient           Patient will benefit from skilled therapeutic intervention in order to improve the following deficits and impairments:  Decreased activity tolerance, Decreased range of motion, Decreased strength,  Hypomobility, Impaired flexibility, Pain  Visit Diagnosis: Acute pain of  right shoulder  Muscle weakness (generalized)  Stiffness of right shoulder, not elsewhere classified     Problem List There are no problems to display for this patient.   [name] 08/10/2020, 10:01 AM  [facility] Physical Therapy [address] [place], [place], [address] Phone: [phone]   Fax:  [phone]  Name: Kevin Lozano MRN: [id number] Date of Birth: 1946-07-24

## 2020-08-20 ENCOUNTER — Encounter: Payer: Self-pay | Admitting: Orthopedic Surgery

## 2020-08-20 ENCOUNTER — Ambulatory Visit (INDEPENDENT_AMBULATORY_CARE_PROVIDER_SITE_OTHER): Payer: PPO | Admitting: Orthopedic Surgery

## 2020-08-20 VITALS — Ht 67.5 in | Wt 157.0 lb

## 2020-08-20 DIAGNOSIS — M75101 Unspecified rotator cuff tear or rupture of right shoulder, not specified as traumatic: Secondary | ICD-10-CM

## 2020-08-26 ENCOUNTER — Encounter: Payer: Self-pay | Admitting: Orthopedic Surgery

## 2020-08-26 NOTE — Progress Notes (Signed)
   Post-Op Visit Note   Patient: Kevin Lozano           Date of Birth: 28-Jun-1946           MRN: [id number] Visit Date: 08/20/2020 PCP: Shirline Frees, MD   Assessment & Plan:  Chief Complaint:  Chief Complaint  Patient presents with  . Right Shoulder - Follow-up    06/04/2020 Right shoulder RCR   Visit Diagnoses:  1. Tear of right supraspinatus tendon     Plan: Patient is a 74 year old male presents s/p right shoulder rotator cuff repair on 06/04/2020.  He notes that he is doing well overall.  He notes a little bit of soreness after working out but overall he has no significant pain when he is working.  He takes occasional ibuprofen but no narcotic medication.  He has finished physical therapy.  Right shoulder range of motion reveals 50 degrees external rotation, 95 degrees abduction, 160 degrees forward flexion.  No crepitus is felt on exam.  Has excellent strength of all 3 rotator cuff muscles.  Incision is healing well.  He wants to return to golf.  He hit 50 balls at a driving range several days ago without any pain.  Plan for patient to ease back into his normal golfing activities.  Follow-up as needed.  Follow-Up Instructions: No follow-ups on file.   Orders:  No orders of the defined types were placed in this encounter.  No orders of the defined types were placed in this encounter.   Imaging: No results found.  PMFS History: There are no problems to display for this patient.  No past medical history on file.  No family history on file.  No past surgical history on file. Social History   Occupational History  . Not on file  Tobacco Use  . Smoking status: Former Research scientist (life sciences)  . Smokeless tobacco: Never Used  Substance and Sexual Activity  . Alcohol use: Not on file  . Drug use: Not on file  . Sexual activity: Not on file

## 2020-08-29 ENCOUNTER — Ambulatory Visit (INDEPENDENT_AMBULATORY_CARE_PROVIDER_SITE_OTHER): Payer: PPO | Admitting: Orthopedic Surgery

## 2020-08-29 ENCOUNTER — Ambulatory Visit (INDEPENDENT_AMBULATORY_CARE_PROVIDER_SITE_OTHER): Payer: PPO

## 2020-08-29 DIAGNOSIS — M25512 Pain in left shoulder: Secondary | ICD-10-CM

## 2020-08-29 DIAGNOSIS — M7542 Impingement syndrome of left shoulder: Secondary | ICD-10-CM

## 2020-09-02 DIAGNOSIS — M25512 Pain in left shoulder: Secondary | ICD-10-CM | POA: Diagnosis not present

## 2020-09-02 DIAGNOSIS — M7542 Impingement syndrome of left shoulder: Secondary | ICD-10-CM | POA: Diagnosis not present

## 2020-09-02 NOTE — Progress Notes (Signed)
Office Visit Note   Patient: Kevin Lozano           Date of Birth: 1946-07-12           MRN: [id number] Visit Date: 08/29/2020 Requested by: Shirline Frees, MD [place],  [place] [address] PCP: Shirline Frees, MD  Subjective: Chief Complaint  Patient presents with  . Left Shoulder - Pain    HPI: Kevin Lozano is a 74 y.o. male who presents to the office complaining of left shoulder pain.  Patient notes 2 to 3 months of pain.  He denies any injury, history of surgery or injection in the left shoulder.  He notes worsening pain in left shoulder.  Right shoulder is doing well following recent rotator cuff surgery.  Pain in the left shoulder wakes him up at night on occasion.  He notes anterior lateral left shoulder pain.  Denies any weakness or grinding.  Denies any neck pain, numbness/tingling, radicular pain.  He has been taking Advil and using Biofreeze with some success.  Denies any history of diabetes..                ROS: All systems reviewed are negative as they relate to the chief complaint within the history of present illness.  Patient denies fevers or chills.  Assessment & Plan: Visit Diagnoses:  1. Acute pain of left shoulder   2. Impingement syndrome of left shoulder     Plan: Patient is a 74 year old male presents complaining of left shoulder pain.  Pain is been ongoing for the last 3 months.  Pain seems to be worsening.  He has had recent rotator cuff surgery of the right shoulder.  He denies any weakness and has excellent strength of the rotator cuff on exam today.  Excellent range of motion as well.  He does have some tenderness over the bicipital groove but otherwise his exam is fairly normal.  Plan to administer left shoulder cortisone injection today.  Patient tolerated procedure well.  Plan to follow-up in 4 weeks for clinical recheck.  Follow-Up Instructions: No follow-ups on file.   Orders:  Orders Placed This Encounter  Procedures    . XR Shoulder Left   No orders of the defined types were placed in this encounter.     Procedures: Large Joint Inj: L subacromial bursa on 09/02/2020 12:46 PM Indications: diagnostic evaluation and pain Details: 18 G 1.5 in needle, posterior approach  Arthrogram: No  Medications: 9 mL bupivacaine 0.5 %; 40 mg methylPREDNISolone acetate 40 MG/ML; 5 mL lidocaine 1 % Outcome: tolerated well, no immediate complications Procedure, treatment alternatives, risks and benefits explained, specific risks discussed. Consent was given by the patient. Immediately prior to procedure a time out was called to verify the correct patient, procedure, equipment, support staff and site/side marked as required. Patient was prepped and draped in the usual sterile fashion.       Clinical Data: No additional findings.  Objective: Vital Signs: There were no vitals taken for this visit.  Physical Exam:  Constitutional: Patient appears well-developed HEENT:  Head: Normocephalic Eyes:EOM are normal Neck: Normal range of motion Cardiovascular: Normal rate Pulmonary/chest: Effort normal Neurologic: Patient is alert Skin: Skin is warm Psychiatric: Patient has normal mood and affect  Ortho Exam: Ortho exam demonstrates left shoulder with 75 degrees external rotation, 130 degrees abduction, 180 degrees forward flexion.  5/5 motor strength of the bilateral supraspinatus, infraspinatus, subscapularis.  5/5 motor strength of the bilateral  grip strength, finger abduction, pronation/supination, bicep, tricep, deltoid.  Negative O'Brien test.  Positive Hawkins impingement sign.  Negative [name] impingement sign.  Tenderness over the bicipital groove.  No significant tenderness over the [facility] joint.  Specialty Comments:  No specialty comments available.  Imaging: No results found.   PMFS History: There are no problems to display for this patient.  No past medical history on file.  No family history on file.   No past surgical history on file. Social History   Occupational History  . Not on file  Tobacco Use  . Smoking status: Former Research scientist (life sciences)  . Smokeless tobacco: Never Used  Substance and Sexual Activity  . Alcohol use: Not on file  . Drug use: Not on file  . Sexual activity: Not on file

## 2020-09-03 DIAGNOSIS — J019 Acute sinusitis, unspecified: Secondary | ICD-10-CM | POA: Diagnosis not present

## 2020-09-03 DIAGNOSIS — Z720 Tobacco use: Secondary | ICD-10-CM | POA: Diagnosis not present

## 2020-09-03 MED ORDER — LIDOCAINE HCL 1 % IJ SOLN
5.0000 mL | INTRAMUSCULAR | Status: AC | PRN
  Administered 2020-09-02: 5 mL

## 2020-09-03 MED ORDER — METHYLPREDNISOLONE ACETATE 40 MG/ML IJ SUSP
40.0000 mg | INTRAMUSCULAR | Status: AC | PRN
  Administered 2020-09-02: 40 mg via INTRA_ARTICULAR

## 2020-09-03 MED ORDER — BUPIVACAINE HCL 0.5 % IJ SOLN
9.0000 mL | INTRAMUSCULAR | Status: AC | PRN
  Administered 2020-09-02: 9 mL via INTRA_ARTICULAR

## 2020-09-06 DIAGNOSIS — B349 Viral infection, unspecified: Secondary | ICD-10-CM | POA: Diagnosis not present

## 2020-09-10 ENCOUNTER — Other Ambulatory Visit (HOSPITAL_COMMUNITY): Payer: Self-pay

## 2020-09-10 ENCOUNTER — Other Ambulatory Visit (HOSPITAL_COMMUNITY): Payer: Self-pay | Admitting: Nurse Practitioner

## 2020-09-10 DIAGNOSIS — R54 Age-related physical debility: Secondary | ICD-10-CM

## 2020-09-10 DIAGNOSIS — U071 COVID-19: Secondary | ICD-10-CM

## 2020-09-10 NOTE — Progress Notes (Signed)
I connected by phone with Kevin Lozano on 09/10/2020 at 9:31 PM to discuss the potential use of a new treatment for mild to moderate COVID-19 viral infection in non-hospitalized patients.  This patient is a 74 y.o. male that meets the FDA criteria for Emergency Use Authorization of COVID monoclonal antibody casirivimab/imdevimab.  Has a (+) direct SARS-CoV-2 viral test result  Has mild or moderate COVID-19   Is NOT hospitalized due to COVID-19  Is within 10 days of symptom onset  Has at least one of the high risk factor(s) for progression to severe COVID-19 and/or hospitalization as defined in EUA.  Specific high risk criteria : Older age (>/= 72 yo)   I have spoken and communicated the following to the patient or parent/caregiver regarding COVID monoclonal antibody treatment:  1. FDA has authorized the emergency use for the treatment of mild to moderate COVID-19 in adults and pediatric patients with positive results of direct SARS-CoV-2 viral testing who are 40 years of age and older weighing at least 40 kg, and who are at high risk for progressing to severe COVID-19 and/or hospitalization.  2. The significant known and potential risks and benefits of COVID monoclonal antibody, and the extent to which such potential risks and benefits are unknown.  3. Information on available alternative treatments and the risks and benefits of those alternatives, including clinical trials.  4. Patients treated with COVID monoclonal antibody should continue to self-isolate and use infection control measures (e.g., wear mask, isolate, social distance, avoid sharing personal items, clean and disinfect "high touch" surfaces, and frequent handwashing) according to CDC guidelines.   5. The patient or parent/caregiver has the option to accept or refuse COVID monoclonal antibody treatment.  After reviewing this information with the patient, The patient agreed to proceed with receiving casirivimab\imdevimab  infusion and will be provided a copy of the Fact sheet prior to receiving the infusion. [name] 09/10/2020 9:31 PM

## 2020-09-11 ENCOUNTER — Ambulatory Visit (HOSPITAL_COMMUNITY)
Admission: RE | Admit: 2020-09-11 | Discharge: 2020-09-11 | Disposition: A | Discharge status: Home / Self Care | Payer: Medicare Other | Source: Ambulatory Visit | Attending: Pulmonary Disease | Admitting: Pulmonary Disease

## 2020-09-11 DIAGNOSIS — Z23 Encounter for immunization: Secondary | ICD-10-CM | POA: Insufficient documentation

## 2020-09-11 DIAGNOSIS — R54 Age-related physical debility: Secondary | ICD-10-CM | POA: Diagnosis present

## 2020-09-11 DIAGNOSIS — U071 COVID-19: Secondary | ICD-10-CM | POA: Diagnosis present

## 2020-09-11 MED ORDER — ALBUTEROL SULFATE HFA 108 (90 BASE) MCG/ACT IN AERS: 2.0000 | INHALATION_SPRAY | Freq: Once | RESPIRATORY_TRACT | Status: DC | PRN

## 2020-09-11 MED ORDER — SODIUM CHLORIDE 0.9 % IV SOLN: INTRAVENOUS | Status: DC | PRN

## 2020-09-11 MED ORDER — EPINEPHRINE 0.3 MG/0.3ML IJ SOAJ: 0.3000 mg | Freq: Once | INTRAMUSCULAR | Status: DC | PRN

## 2020-09-11 MED ORDER — DIPHENHYDRAMINE HCL 50 MG/ML IJ SOLN: 50.0000 mg | Freq: Once | INTRAMUSCULAR | Status: DC | PRN

## 2020-09-11 MED ORDER — METHYLPREDNISOLONE SODIUM SUCC 125 MG IJ SOLR: 125.0000 mg | Freq: Once | INTRAMUSCULAR | Status: DC | PRN

## 2020-09-11 MED ORDER — FAMOTIDINE IN NACL 20-0.9 MG/50ML-% IV SOLN: 20.0000 mg | Freq: Once | INTRAVENOUS | Status: DC | PRN

## 2020-09-11 MED ORDER — SODIUM CHLORIDE 0.9 % IV SOLN
1200.0000 mg | Freq: Once | INTRAVENOUS | Status: AC
  Administered 2020-09-11: 1200 mg via INTRAVENOUS

## 2020-09-11 NOTE — Progress Notes (Signed)
  Diagnosis: COVID-19  Physician:Dr Wright   Procedure: [facility] Med: casirivimab\imdevimab infusion - Provided patient with casirivimab\imdevimab fact sheet for patients, parents and caregivers prior to infusion.  Complications: No immediate complications noted.  Discharge: Discharged home   Mai Longnecker W 09/11/2020  

## 2020-09-11 NOTE — Discharge Instructions (Signed)

## 2020-09-12 ENCOUNTER — Telehealth: Payer: Self-pay | Admitting: Orthopedic Surgery

## 2020-09-12 NOTE — Telephone Encounter (Signed)
Pt would like to know how necessary his appt on 09/26/20? Would like CB to discuss  [phone]

## 2020-09-13 NOTE — Telephone Encounter (Signed)
Tried calling patient. No answer. LMVM advising if he was improving and did not feel his appointment was necessary we were ok if he wanted to cancel.

## 2020-09-26 ENCOUNTER — Ambulatory Visit: Payer: PPO | Admitting: Orthopedic Surgery

## 2020-10-22 ENCOUNTER — Ambulatory Visit (INDEPENDENT_AMBULATORY_CARE_PROVIDER_SITE_OTHER): Payer: PPO | Admitting: Orthopedic Surgery

## 2020-10-22 ENCOUNTER — Encounter: Payer: Self-pay | Admitting: Orthopedic Surgery

## 2020-10-22 DIAGNOSIS — M25512 Pain in left shoulder: Secondary | ICD-10-CM

## 2020-10-22 MED ORDER — METHYLPREDNISOLONE 4 MG PO TBPK: ORAL_TABLET | ORAL | 0 refills | Status: DC

## 2020-10-24 ENCOUNTER — Encounter: Payer: Self-pay | Admitting: Orthopedic Surgery

## 2020-10-24 NOTE — Progress Notes (Signed)
Office Visit Note   Patient: Kevin Lozano           Date of Birth: 12/22/1946           MRN: [id number] Visit Date: 10/22/2020 Requested by: Shirline Frees, MD [address] [place],  [place] [address] PCP: Shirline Frees, MD  Subjective: Chief Complaint  Patient presents with  . Right Shoulder - Pain    06/04/2020 Right shoulder RCR  . Left Shoulder - Pain    HPI: Kevin Lozano is a 74 year old patient with bilateral shoulder pain.  He had right shoulder rotator cuff repair 06/04/2020.  Has been doing reasonably well with that but reports some occasional pain.  Did when a golf tournament recently.  Left shoulder had previous injection 08/29/2020 which gave him relief for "a little while".  Takes ibuprofen but only occasionally.  He is not in physical therapy at this time.  Describes some aching pain which is slightly worse at night.              ROS: All systems reviewed are negative as they relate to the chief complaint within the history of present illness.  Patient denies  fevers or chills.   Assessment & Plan: Visit Diagnoses:  1. Acute pain of left shoulder     Plan: Impression is bilateral shoulder pain.  Both shoulders have pretty good range of motion and strength at this time.  I think is possible he may have some left shoulder rotator cuff pathology which is mild.  He does not really want to pursue any type of intervention on the left shoulder at this time which is understandable.  Right shoulder is actually doing pretty well in terms of strength and range of motion.  On exam no definite evidence of recurrent tearing.  I will try him on a 6-day Medrol Dosepak to see if we can calm down some of the pain he is having.  Follow-up with me as needed.  Follow-Up Instructions: Return if symptoms worsen or fail to improve.   Orders:  No orders of the defined types were placed in this encounter.  Meds ordered this encounter  Medications  . methylPREDNISolone (MEDROL  DOSEPAK) 4 MG TBPK tablet    Sig: Take as directed    Dispense:  21 tablet    Refill:  0      Procedures: No procedures performed   Clinical Data: No additional findings.  Objective: Vital Signs: There were no vitals taken for this visit.  Physical Exam:   Constitutional: Patient appears well-developed HEENT:  Head: Normocephalic Eyes:EOM are normal Neck: Normal range of motion Cardiovascular: Normal rate Pulmonary/chest: Effort normal Neurologic: Patient is alert Skin: Skin is warm Psychiatric: Patient has normal mood and affect    Ortho Exam: Ortho exam demonstrates full active and passive range of motion of the cervical spine.  Both shoulders have forward flexion and abduction both above 90 degrees.  Rotator cuff strength is good infraspinatus supraspinatus subscap muscle testing.  No discrete AC joint tenderness to direct palpation.  No masses lymphadenopathy or skin changes noted in that shoulder region.  No paresthesias in the arms.  Not much in the way of coarse grinding or crepitus with passive range of motion of either shoulder.  Specialty Comments:  No specialty comments available.  Imaging: No results found.   PMFS History: There are no problems to display for this patient.  No past medical history on file.  No family history on file.  No past surgical history on file. Social History   Occupational History  . Not on file  Tobacco Use  . Smoking status: Former Research scientist (life sciences)  . Smokeless tobacco: Never Used  Substance and Sexual Activity  . Alcohol use: Not on file  . Drug use: Not on file  . Sexual activity: Not on file

## 2021-02-12 DIAGNOSIS — F17200 Nicotine dependence, unspecified, uncomplicated: Secondary | ICD-10-CM | POA: Diagnosis not present

## 2021-02-12 DIAGNOSIS — Z0000 Encounter for general adult medical examination without abnormal findings: Secondary | ICD-10-CM | POA: Diagnosis not present

## 2021-02-12 DIAGNOSIS — M150 Primary generalized (osteo)arthritis: Secondary | ICD-10-CM | POA: Diagnosis not present

## 2021-02-12 DIAGNOSIS — K219 Gastro-esophageal reflux disease without esophagitis: Secondary | ICD-10-CM | POA: Diagnosis not present

## 2021-02-12 DIAGNOSIS — E7800 Pure hypercholesterolemia, unspecified: Secondary | ICD-10-CM | POA: Diagnosis not present

## 2021-02-18 ENCOUNTER — Telehealth: Payer: Self-pay | Admitting: Acute Care

## 2021-02-18 DIAGNOSIS — F17210 Nicotine dependence, cigarettes, uncomplicated: Secondary | ICD-10-CM

## 2021-02-18 DIAGNOSIS — Z87891 Personal history of nicotine dependence: Secondary | ICD-10-CM

## 2021-02-18 NOTE — Telephone Encounter (Signed)
Spoke with pt and scheduled SDMV 03/20/21 9:00 CT ordered and will be scheduled

## 2021-03-11 DIAGNOSIS — H43811 Vitreous degeneration, right eye: Secondary | ICD-10-CM | POA: Diagnosis not present

## 2021-03-11 DIAGNOSIS — Z961 Presence of intraocular lens: Secondary | ICD-10-CM | POA: Diagnosis not present

## 2021-03-11 DIAGNOSIS — D23121 Other benign neoplasm of skin of left upper eyelid, including canthus: Secondary | ICD-10-CM | POA: Diagnosis not present

## 2021-03-14 DIAGNOSIS — D485 Neoplasm of uncertain behavior of skin: Secondary | ICD-10-CM | POA: Diagnosis not present

## 2021-03-20 ENCOUNTER — Encounter: Payer: Self-pay | Admitting: Acute Care

## 2021-03-20 ENCOUNTER — Ambulatory Visit (INDEPENDENT_AMBULATORY_CARE_PROVIDER_SITE_OTHER): Payer: PPO | Admitting: Acute Care

## 2021-03-20 ENCOUNTER — Ambulatory Visit
Admission: RE | Admit: 2021-03-20 | Discharge: 2021-03-20 | Disposition: A | Discharge status: Home / Self Care | Payer: PPO | Source: Ambulatory Visit | Attending: Acute Care | Admitting: Acute Care

## 2021-03-20 ENCOUNTER — Other Ambulatory Visit: Payer: Self-pay

## 2021-03-20 VITALS — BP 122/74 | HR 67 | Temp 98.3°F | Ht 67.5 in | Wt 156.2 lb

## 2021-03-20 DIAGNOSIS — F17210 Nicotine dependence, cigarettes, uncomplicated: Secondary | ICD-10-CM | POA: Diagnosis not present

## 2021-03-20 DIAGNOSIS — Z87891 Personal history of nicotine dependence: Secondary | ICD-10-CM

## 2021-03-20 DIAGNOSIS — Z122 Encounter for screening for malignant neoplasm of respiratory organs: Secondary | ICD-10-CM

## 2021-03-20 NOTE — Patient Instructions (Signed)
Thank you for participating in the [facility]. It was our pleasure to meet you today. We will call you with the results of your scan within the next few days. Your scan will be assigned a Lung RADS category score by the physicians reading the scans.  This Lung RADS score determines follow up scanning.  See below for description of categories, and follow up screening recommendations. We will be in touch to schedule your follow up screening annually or based on recommendations of our providers. We will fax a copy of your scan results to your Primary Care Physician, or the physician who referred you to the program, to ensure they have the results. Please call the office if you have any questions or concerns regarding your scanning experience or results.  Our office number is [phone]. Please speak with Denise Phelps, RN. She is our Lung Cancer Screening RN. If she is unavailable when you call, please have the office staff send her a message. She will return your call at her earliest convenience. Remember, if your scan is normal, we will scan you annually as long as you continue to meet the criteria for the program. (Age 55-77, Current smoker or smoker who has quit within the last 15 years). If you are a smoker, remember, quitting is the single most powerful action that you can take to decrease your risk of lung cancer and other pulmonary, breathing related problems. We know quitting is hard, and we are here to help.  Please let us know if there is anything we can do to help you meet your goal of quitting. If you are a former smoker, congratulations. We are proud of you! Remain smoke free! Remember you can refer friends or family members through the number above.  We will screen them to make sure they meet criteria for the program. Thank you for helping us take better care of you by participating in Lung Screening.  Lung RADS Categories:  Lung RADS 1: no nodules  or definitely non-concerning nodules.  Recommendation is for a repeat annual scan in 12 months.  Lung RADS 2:  nodules that are non-concerning in appearance and behavior with a very low likelihood of becoming an active cancer. Recommendation is for a repeat annual scan in 12 months.  Lung RADS 3: nodules that are probably non-concerning , includes nodules with a low likelihood of becoming an active cancer.  Recommendation is for a 6-month repeat screening scan. Often noted after an upper respiratory illness. We will be in touch to make sure you have no questions, and to schedule your 6-month scan.  Lung RADS 4 A: nodules with concerning findings, recommendation is most often for a follow up scan in 3 months or additional testing based on our provider's assessment of the scan. We will be in touch to make sure you have no questions and to schedule the recommended 3 month follow up scan.  Lung RADS 4 B:  indicates findings that are concerning. We will be in touch with you to schedule additional diagnostic testing based on our provider's  assessment of the scan.   

## 2021-03-20 NOTE — Progress Notes (Signed)
Shared Decision Making Visit Lung Cancer Screening Program [id number])   Eligibility:  Age 75 y.o.  Pack Years Smoking History Calculation 31 pack year smoking history (# packs/per year x # years smoked)  Recent History of coughing up blood  no  Unexplained weight loss? no ( >Than 15 pounds within the last 6 months )  Prior History Lung / other cancer no (Diagnosis within the last 5 years already requiring surveillance chest CT Scans).  Smoking Status Current Smoker  Former Smokers: Years since quit: NA  Quit Date: NA  Visit Components:  Discussion included one or more decision making aids. yes  Discussion included risk/benefits of screening. yes  Discussion included potential follow up diagnostic testing for abnormal scans. yes  Discussion included meaning and risk of over diagnosis. yes  Discussion included meaning and risk of False Positives. yes  Discussion included meaning of total radiation exposure. yes  Counseling Included:  Importance of adherence to annual lung cancer LDCT screening. yes  Impact of comorbidities on ability to participate in the program. yes  Ability and willingness to under diagnostic treatment. yes  Smoking Cessation Counseling:  Current Smokers:   Discussed importance of smoking cessation. yes  Information about tobacco cessation classes and interventions provided to patient. yes  Patient provided with "ticket" for LDCT Scan. yes  Symptomatic Patient. no  Counseling  Diagnosis Code: Tobacco Use Z72.0  Asymptomatic Patient yes  Counseling (Intermediate counseling: > three minutes counseling) U5427  Former Smokers:   Discussed the importance of maintaining cigarette abstinence. yes  Diagnosis Code: Personal History of Nicotine Dependence. C62.376  Information about tobacco cessation classes and interventions provided to patient. Yes  Patient provided with "ticket" for LDCT Scan. yes  Written Order for Lung Cancer  Screening with LDCT placed in Epic. Yes (CT Chest Lung Cancer Screening Low Dose W/O CM) [id number] Z12.2-Screening of respiratory organs Z87.891-Personal history of nicotine dependence  BP 122/74 (BP Location: Right Arm, Cuff Size: Normal)   Pulse 67   Temp 98.3 F (36.8 C) (Oral)   Ht 5' 7.5" (1.715 m)   Wt 156 lb 3.2 oz (70.9 kg)   SpO2 97%   BMI 24.10 kg/m   I have spent 25 minutes of face to face time with Mr. Griep discussing the risks and benefits of lung cancer screening. We viewed a power point together that explained in detail the above noted topics. We paused at intervals to allow for questions to be asked and answered to ensure understanding.We discussed that the single most powerful action that he can take to decrease his risk of developing lung cancer is to quit smoking. We discussed whether or not he is ready to commit to setting a quit date. We discussed options for tools to aid in quitting smoking including nicotine replacement therapy, non-nicotine medications, support groups, Quit Smart classes, and behavior modification. We discussed that often times setting smaller, more achievable goals, such as eliminating 1 cigarette a day for a week and then 2 cigarettes a day for a week can be helpful in slowly decreasing the number of cigarettes smoked. This allows for a sense of accomplishment as well as providing a clinical benefit. I gave him the " Be Stronger Than Your Excuses" card with contact information for community resources, classes, free nicotine replacement therapy, and access to mobile apps, text messaging, and on-line smoking cessation help. I have also given him my card and contact information in the event he needs to contact me. We discussed the time  and location of the scan, and that either Doroteo Glassman RN or I will call with the results within 24-48 hours of receiving them. I have offered him  a copy of the power point we viewed  as a resource in the event they need  reinforcement of the concepts we discussed today in the office. The patient verbalized understanding of all of  the above and had no further questions upon leaving the office. They have my contact information in the event they have any further questions.  I spent 3 minutes counseling on smoking cessation and the health risks of continued tobacco abuse.  I explained to the patient that there has been a high incidence of coronary artery disease noted on these exams. I explained that this is a non-gated exam therefore degree or severity cannot be determined. This patient is currently on statin therapy. I have asked the patient to follow-up with their PCP regarding any incidental finding of coronary artery disease and management with diet or medication as their PCP  feels is clinically indicated. The patient verbalized understanding of the above and had no further questions upon completion of the visit.      Magdalen Spatz, NP 03/20/2021

## 2021-03-22 DIAGNOSIS — K4090 Unilateral inguinal hernia, without obstruction or gangrene, not specified as recurrent: Secondary | ICD-10-CM | POA: Diagnosis not present

## 2021-03-22 DIAGNOSIS — E7800 Pure hypercholesterolemia, unspecified: Secondary | ICD-10-CM | POA: Diagnosis not present

## 2021-04-04 ENCOUNTER — Other Ambulatory Visit: Payer: Self-pay | Admitting: *Deleted

## 2021-04-04 DIAGNOSIS — Z87891 Personal history of nicotine dependence: Secondary | ICD-10-CM

## 2021-04-04 DIAGNOSIS — F17210 Nicotine dependence, cigarettes, uncomplicated: Secondary | ICD-10-CM

## 2021-04-04 NOTE — Progress Notes (Signed)
Please call patient and let them  know their  low dose Ct was read as a Lung RADS 2: nodules that are benign in appearance and behavior with a very low likelihood of becoming a clinically active cancer due to size or lack of growth. Recommendation per radiology is for a repeat LDCT in 12 months. .Please let them  know we will order and schedule their  annual screening scan for 02/2022. Please let them  know there was notation of CAD on their  scan.  Please remind the patient  that this is a non-gated exam therefore degree or severity of disease  cannot be determined. Please have them  follow up with their PCP regarding potential risk factor modification, dietary therapy or pharmacologic therapy if clinically indicated. Pt.  is  currently on statin therapy. Please place order for annual  screening scan for  02/2022 and fax results to PCP. Thanks so much.  Langley Gauss, have him follow up with PCP about the notation of CAD. He is on statin therapy.Thanks so much

## 2021-06-06 ENCOUNTER — Encounter: Payer: Self-pay | Admitting: Interventional Cardiology

## 2021-06-06 ENCOUNTER — Ambulatory Visit: Payer: PPO | Admitting: Interventional Cardiology

## 2021-06-06 ENCOUNTER — Other Ambulatory Visit: Payer: Self-pay

## 2021-06-06 VITALS — BP 124/68 | HR 64 | Ht 67.5 in | Wt 154.6 lb

## 2021-06-06 DIAGNOSIS — I2510 Atherosclerotic heart disease of native coronary artery without angina pectoris: Secondary | ICD-10-CM

## 2021-06-06 DIAGNOSIS — E782 Mixed hyperlipidemia: Secondary | ICD-10-CM

## 2021-06-06 DIAGNOSIS — Z720 Tobacco use: Secondary | ICD-10-CM

## 2021-06-06 MED ORDER — ASPIRIN EC 81 MG PO TBEC: 81.0000 mg | DELAYED_RELEASE_TABLET | Freq: Every day | ORAL | 3 refills | Status: AC

## 2021-06-06 NOTE — Patient Instructions (Signed)
Medication Instructions:  Your physician has recommended you make the following change in your medication: Start aspirin 81 mg by mouth daily  *If you need a refill on your cardiac medications before your next appointment, please call your pharmacy*   Lab Work: none If you have labs (blood work) drawn today and your tests are completely normal, you will receive your results only by: [place] (if you have MyChart) OR A paper copy in the mail If you have any lab test that is abnormal or we need to change your treatment, we will call you to review the results.   Testing/Procedures: none   Follow-Up: At [facility], you and your health needs are our priority.  As part of our continuing mission to provide you with exceptional heart care, we have created designated Provider Care Teams.  These Care Teams include your primary Cardiologist (physician) and Advanced Practice Providers (APPs -  Physician Assistants and Nurse Practitioners) who all work together to provide you with the care you need, when you need it.  We recommend signing up for the patient portal called "MyChart".  Sign up information is provided on this After Visit Summary.  MyChart is used to connect with patients for Virtual Visits (Telemedicine).  Patients are able to view lab/test results, encounter notes, upcoming appointments, etc.  Non-urgent messages can be sent to your provider as well.   To learn more about what you can do with MyChart, go to [email or web address].    Your next appointment:   12 month(s)  The format for your next appointment:   In Person  Provider:   You may see Larae Grooms, MD or one of the following Advanced Practice Providers on your designated Care Team:   Melina Copa, PA-C Ermalinda Barrios, PA-C   Other Instructions High-Fiber Eating Plan Fiber, also called dietary fiber, is a type of carbohydrate. It is found foods such as fruits, vegetables, whole grains, and beans. A  high-fiber diet can have many health benefits. Your health care provider may recommend a high-fiber diet to help: Prevent constipation. Fiber can make your bowel movements more regular. Lower your cholesterol. Relieve the following conditions: Inflammation of veins in the anus (hemorrhoids). Inflammation of specific areas of the digestive tract (uncomplicated diverticulosis). A problem of the large intestine, also called the colon, that sometimes causes pain and diarrhea (irritable bowel syndrome, or IBS). Prevent overeating as part of a weight-loss plan. Prevent heart disease, type 2 diabetes, and certain cancers. What are tips for following this plan? Reading food labels  Check the nutrition facts label on food products for the amount of dietary fiber. Choose foods that have 5 grams of fiber or more per serving. The goals for recommended daily fiber intake include: Men (age 79 or younger): 34-38 g. Men (over age 79): 28-34 g. Women (age 17 or younger): 25-28 g. Women (over age 4): 22-25 g. Your daily fiber goal is _____________ g. Shopping Choose whole fruits and vegetables instead of processed forms, such as apple juice or applesauce. Choose a wide variety of high-fiber foods such as avocados, lentils, oats, and kidney beans. Read the nutrition facts label of the foods you choose. Be aware of foods with added fiber. These foods often have high sugar and sodium amounts per serving. Cooking Use whole-grain flour for baking and cooking. Cook with brown rice instead of white rice. Meal planning Start the day with a breakfast that is high in fiber, such as a cereal that contains 5 g of  fiber or more per serving. Eat breads and cereals that are made with whole-grain flour instead of refined flour or white flour. Eat brown rice, bulgur wheat, or millet instead of white rice. Use beans in place of meat in soups, salads, and pasta dishes. Be sure that half of the grains you eat each day are  whole grains. General information You can get the recommended daily intake of dietary fiber by: Eating a variety of fruits, vegetables, grains, nuts, and beans. Taking a fiber supplement if you are not able to take in enough fiber in your diet. It is better to get fiber through food than from a supplement. Gradually increase how much fiber you consume. If you increase your intake of dietary fiber too quickly, you may have bloating, cramping, or gas. Drink plenty of water to help you digest fiber. Choose high-fiber snacks, such as berries, raw vegetables, nuts, and popcorn. What foods should I eat? Fruits Berries. Pears. Apples. Oranges. Avocado. Prunes and raisins. Dried figs. Vegetables Sweet potatoes. Spinach. Kale. Artichokes. Cabbage. Broccoli. Cauliflower.Green peas. Carrots. Squash. Grains Whole-grain breads. Multigrain cereal. Oats and oatmeal. Brown rice. Barley.Bulgur wheat. Poca. Quinoa. Bran muffins. Popcorn. Rye wafer crackers. Meats and other proteins Navy beans, kidney beans, and pinto beans. Soybeans. Split peas. Lentils. Nutsand seeds. Dairy Fiber-fortified yogurt. Beverages Fiber-fortified soy milk. Fiber-fortified orange juice. Other foods Fiber bars. The items listed above may not be a complete list of recommended foods and beverages. Contact a dietitian for more information. What foods should I avoid? Fruits Fruit juice. Cooked, strained fruit. Vegetables Fried potatoes. Canned vegetables. Well-cooked vegetables. Grains White bread. Pasta made with refined flour. White rice. Meats and other proteins Fatty cuts of meat. Fried chicken or fried fish. Dairy Milk. Yogurt. Cream cheese. Sour cream. Fats and oils Butters. Beverages Soft drinks. Other foods Cakes and pastries. The items listed above may not be a complete list of foods and beverages to avoid. Talk with your dietitian about what choices are best for you. Summary Fiber is a type of carbohydrate.  It is found in foods such as fruits, vegetables, whole grains, and beans. A high-fiber diet has many benefits. It can help to prevent constipation, lower blood cholesterol, aid weight loss, and reduce your risk of heart disease, diabetes, and certain cancers. Increase your intake of fiber gradually. Increasing fiber too quickly may cause cramping, bloating, and gas. Drink plenty of water while you increase the amount of fiber you consume. The best sources of fiber include whole fruits and vegetables, whole grains, nuts, seeds, and beans. This information is not intended to replace advice given to you by your health care provider. Make sure you discuss any questions you have with your healthcare provider. Document Revised: 04/12/2020 Document Reviewed: 04/12/2020 Elsevier Patient Education  2022 [employer].

## 2021-06-06 NOTE — Progress Notes (Signed)
Cardiology Office Note   Date:  06/06/2021   ID:  KAITLYN SKOWRON, DOB July 12, 1946, MRN [id number]  PCP:  Shirline Frees, MD    No chief complaint on file.  Coronary artery calcification  Wt Readings from Last 3 Encounters:  06/06/21 154 lb 9.6 oz (70.1 kg)  03/20/21 156 lb 3.2 oz (70.9 kg)  08/20/20 157 lb (71.2 kg)       History of Present Illness: Kevin Lozano is a 75 y.o. male who is being seen today for the evaluation of coronary calcification at the request of Shirline Frees, MD.   CT scan showed : "Lung-RADS 2S, benign appearance or behavior. Continue annual screening with low-dose chest CT without contrast in 12 months. 2. The "S" modifier above refers to potentially clinically significant non lung cancer related findings. Specifically, there is aortic atherosclerosis, in addition to left main and 3 vessel coronary artery disease. Please note that although the presence of coronary artery calcium documents the presence of coronary artery disease, the severity of this disease and any potential stenosis cannot be assessed on this non-gated CT examination. Assessment for potential risk factor modification, dietary therapy or pharmacologic therapy may be warranted, if clinically indicated. 3. Mild diffuse bronchial wall thickening with mild centrilobular and paraseptal emphysema; imaging findings suggestive of underlying COPD."  Denies : Chest pain. Dizziness. Leg edema. Nitroglycerin use. Orthopnea. Palpitations. Paroxysmal nocturnal dyspnea. Shortness of breath. Syncope.    Plays golf.  No problems with walking up hills on the golf course.  He is tired by the end.    Stress test was done several years ago and was ok per his report.   Past Medical History:  Diagnosis Date   COPD (chronic obstructive pulmonary disease) ([place])    Coronary artery calcification     Past Surgical History:  Procedure Laterality Date   TOTAL KNEE ARTHROPLASTY Bilateral       Current Outpatient Medications  Medication Sig Dispense Refill   atorvastatin (LIPITOR) 20 MG tablet Take 20 mg by mouth daily.     No current facility-administered medications for this visit.    Allergies:   Sulfa antibiotics    Social History:  The patient  reports that he has been smoking cigarettes. He has been smoking an average of 1.50 packs per day. He has never used smokeless tobacco.   Family History:  The patient's family history includes Hyperlipidemia in his father and mother.    ROS:  Please see the history of present illness.   Otherwise, review of systems are positive for difficulty stopping smoking.   All other systems are reviewed and negative.    PHYSICAL EXAM: VS:  BP 124/68   Pulse 64   Ht 5' 7.5" (1.715 m)   Wt 154 lb 9.6 oz (70.1 kg)   SpO2 94%   BMI 23.86 kg/m  , BMI Body mass index is 23.86 kg/m. GEN: Well nourished, well developed, in no acute distress HEENT: normal Neck: no JVD, carotid bruits, or masses Cardiac: RRR; no murmurs, rubs, or gallops,no edema  Respiratory:  clear to auscultation bilaterally, normal work of breathing GI: soft, nontender, nondistended, + BS MS: no deformity or atrophy; 2+ PT pulses 2+ radial pulses bilaterally Skin: warm and dry, no rash Neuro:  Strength and sensation are intact Psych: euthymic mood, full affect   EKG:   The ekg ordered today demonstrates NSR, no ST changes   Recent Labs: No results found for requested labs within last 8760  hours.   Lipid Panel No results found for: CHOL, TRIG, HDL, CHOLHDL, VLDL, LDLCALC, LDLDIRECT   Other studies Reviewed: Additional studies/ records that were reviewed today with results demonstrating: LDL 94 in 2/22; normal Cr. .   ASSESSMENT AND PLAN:  Coronary calcium: No angina. Continue preventive therapy.  Continue regular waling.  If he has a change in exercise tolerance or chest pressure/tightness, he should let us know.   Hyperlipidemia: Continue  atorvastatin.  LDL 94. Would start aspirin 81 mg daily.  Watch for any bleeding. TObacco abuse:  Needs to stop smoking.  We discussed patches, pills.  He was considering lozenges and may try them, OTC.  Whole food, plant based diet.    Current medicines are reviewed at length with the patient today.  The patient concerns regarding his medicines were addressed.  The following changes have been made:    Labs/ tests ordered today include:  No orders of the defined types were placed in this encounter.   Recommend 150 minutes/week of aerobic exercise Low fat, low carb, high fiber diet recommended  Disposition:   FU in 1 year   Signed, Larae Grooms, MD  06/06/2021 2:07 PM    [facility] [place], [place], [place]  [address] Phone: [phone]; Fax: [phone]

## 2021-07-09 ENCOUNTER — Other Ambulatory Visit: Payer: Self-pay | Admitting: Surgery

## 2021-07-09 DIAGNOSIS — K4090 Unilateral inguinal hernia, without obstruction or gangrene, not specified as recurrent: Secondary | ICD-10-CM | POA: Diagnosis not present

## 2021-07-23 NOTE — Patient Instructions (Addendum)
DUE TO COVID-19 ONLY ONE VISITOR IS ALLOWED TO COME WITH YOU AND STAY IN THE WAITING ROOM ONLY DURING PRE OP AND PROCEDURE.   **NO VISITORS ARE ALLOWED IN THE SHORT STAY AREA OR RECOVERY ROOM!!**       Your procedure is scheduled on: 08/01/21   Report to [facility] Main  Entrance    Report to admitting at 6:45 AM   Call this number if you have problems the morning of surgery [phone]   Do not eat food :After Midnight.   May have liquids until  5:45 AM  day of surgery  CLEAR LIQUID DIET  Foods Allowed                                                                     Foods Excluded  Water, Black Coffee and tea, regular and decaf                liquids that you cannot  Plain Jell-O in any flavor  (No red)                                      see through such as: Fruit ices (not with fruit pulp)                                              milk, soups, orange juice              Iced Popsicles (No red)                                                  All solid food                                   Apple juices Sports drinks like Gatorade (No red) Lightly seasoned clear broth or consume(fat free) Sugar, honey syrup      The day of surgery:  Drink ONE (1) Pre-Surgery Clear Ensure by 5:45 am the morning of surgery. Drink in one sitting. Do not sip.  This drink was given to you during your hospital  pre-op appointment visit. Nothing else to drink after completing the  Pre-Surgery Clear Ensure.          If you have questions, please contact your surgeon's office.     Oral Hygiene is also important to reduce your risk of infection.                                    Remember - BRUSH YOUR TEETH THE MORNING OF SURGERY WITH YOUR REGULAR TOOTHPASTE   Do NOT smoke after Midnight   Take these medicines the morning of surgery with A SIP OF WATER: Tylenol, Atorvastatin.  Call your primary care provider  and ask about pre surgery instructions regarding Aspirin.                                You may not have any metal on your body including jewelry, and body piercing             Do not wear lotions, powders, cologne, or deodorant              Men may shave face and neck.   Do not bring valuables to the hospital. [place].   Contacts, dentures or bridgework may not be worn into surgery.    Patients discharged the day of surgery will not be allowed to drive home.  Special Instructions: Bring a copy of your healthcare power of attorney and living will documents         the day of surgery if you haven't scanned them in before.   Please read over the following fact sheets you were given: IF YOU HAVE QUESTIONS ABOUT YOUR PRE OP INSTRUCTIONS PLEASE CALL [phone]- [facility] - Preparing for Surgery Before surgery, you can play an important role.  Because skin is not sterile, your skin needs to be as free of germs as possible.  You can reduce the number of germs on your skin by washing with CHG (chlorahexidine gluconate) soap before surgery.  CHG is an antiseptic cleaner which kills germs and bonds with the skin to continue killing germs even after washing. Please DO NOT use if you have an allergy to CHG or antibacterial soaps.  If your skin becomes reddened/irritated stop using the CHG and inform your nurse when you arrive at Short Stay. Do not shave (including legs and underarms) for at least 48 hours prior to the first CHG shower.  You may shave your face/neck.  Please follow these instructions carefully:  1.  Shower with CHG Soap the night before surgery and the  morning of surgery.  2.  If you choose to wash your hair, wash your hair first as usual with your normal  shampoo.  3.  After you shampoo, rinse your hair and body thoroughly to remove the shampoo.                             4.  Use CHG as you would any other liquid soap.  You can apply chg directly to the skin and wash.  Gently with a  scrungie or clean washcloth.  5.  Apply the CHG Soap to your body ONLY FROM THE NECK DOWN.   Do   not use on face/ open                           Wound or open sores. Avoid contact with eyes, ears mouth and   genitals (private parts).                       Wash face,  Genitals (private parts) with your normal soap.             6.  Wash thoroughly, paying special attention to the area where your    surgery  will be performed.  7.  Thoroughly rinse your body  with warm water from the neck down.  8.  DO NOT shower/wash with your normal soap after using and rinsing off the CHG Soap.                9.  Pat yourself dry with a clean towel.            10.  Wear clean pajamas.            11.  Place clean sheets on your bed the night of your first shower and do not  sleep with pets. Day of Surgery : Do not apply any lotions/deodorants the morning of surgery.  Please wear clean clothes to the hospital/surgery center.  FAILURE TO FOLLOW THESE INSTRUCTIONS MAY RESULT IN THE CANCELLATION OF YOUR SURGERY  PATIENT SIGNATURE_________________________________  NURSE SIGNATURE__________________________________  ________________________________________________________________________

## 2021-07-23 NOTE — Progress Notes (Addendum)
COVID Vaccine Completed: No Date COVID Vaccine completed: Has received booster: COVID vaccine manufacturer: Edith Endave   Date of COVID positive in last 90 days: No  PCP - Shirline Frees, MD Cardiologist - Larae Grooms, MD  Chest x-ray - chest CT 03/20/21 Epic EKG - 06/06/21 Epic Stress Test - long time ago ECHO - N/a Cardiac Cath - N/a Pacemaker/ICD device last checked: N/a Spinal Cord Stimulator: N/a  Sleep Study - N/a CPAP -   Fasting Blood Sugar - N/a Checks Blood Sugar _____ times a day  Blood Thinner Instructions:  Aspirin Instructions: '81mg'$  no instructions given. Instructed to contact provider and ask if he should hold it prior to surgery. Last Dose:  Activity level: Can go up a flight of stairs and perform activities of daily living without stopping and without symptoms of chest pain or shortness of breath.     Anesthesia review:   Patient denies shortness of breath, fever, cough and chest pain at PAT appointment   Patient verbalized understanding of instructions that were given to them at the PAT appointment. Patient was also instructed that they will need to review over the PAT instructions again at home before surgery.

## 2021-07-24 ENCOUNTER — Encounter (HOSPITAL_COMMUNITY)
Admission: RE | Admit: 2021-07-24 | Discharge: 2021-07-24 | Disposition: A | Discharge status: Home / Self Care | Payer: PPO | Source: Ambulatory Visit | Attending: Surgery | Admitting: Surgery

## 2021-07-24 ENCOUNTER — Other Ambulatory Visit: Payer: Self-pay

## 2021-07-24 ENCOUNTER — Encounter (HOSPITAL_COMMUNITY): Payer: Self-pay

## 2021-07-24 DIAGNOSIS — Z01812 Encounter for preprocedural laboratory examination: Secondary | ICD-10-CM | POA: Insufficient documentation

## 2021-07-24 HISTORY — DX: Unspecified osteoarthritis, unspecified site: M19.90

## 2021-07-24 LAB — CBC
HCT: 43 % (ref 39.0–52.0)
Hemoglobin: 14.6 g/dL (ref 13.0–17.0)
MCH: 31.9 pg (ref 26.0–34.0)
MCHC: 34 g/dL (ref 30.0–36.0)
MCV: 94.1 fL (ref 80.0–100.0)
Platelets: 244 10*3/uL (ref 150–400)
RBC: 4.57 MIL/uL (ref 4.22–5.81)
RDW: 13.7 % (ref 11.5–15.5)
WBC: 10.6 10*3/uL — ABNORMAL HIGH (ref 4.0–10.5)
nRBC: 0 % (ref 0.0–0.2)

## 2021-07-31 ENCOUNTER — Encounter (HOSPITAL_COMMUNITY): Payer: Self-pay | Admitting: Surgery

## 2021-07-31 NOTE — H&P (Signed)
PROVIDER: Beverlee Nims, MD  MRN: [id number] DOB: 1946/07/18 DATE OF ENCOUNTER: 07/09/2021  Subjective   Chief Complaint: Hernia (Inguinal (Lt))   History of Present Illness: Kevin Lozano is a 75 y.o. male who is seen today as an office consultation at the request of Dr. Kenton Kingfisher for evaluation of Hernia (Inguinal (Lt)) .   This gentleman was referred here for evaluation of left inguinal hernia. He had a right inguinal hernia repaired back in the 1970s. He was doing heavy lifting when he noticed increasing pain and a small bulge in the left inguinal area. He has mild discomfort but no obstructive symptoms. He will still get occasional discomfort on the right side. He has a history of COPD. He still plays golf almost daily and walks while doing this without any shortness of breath. He is otherwise without complaints. He reports that the bulge was easily reduced. The pain is only mild to moderate and a dull ache or burning and does not refer anywhere else.  Review of Systems: A complete review of systems was obtained from the patient. I have reviewed this information and discussed as appropriate with the patient. See HPI as well for other ROS.  ROS   Medical History: Past Medical History:  Diagnosis Date   Arthritis   Hyperlipemia   There is no problem list on file for this patient.  Past Surgical History:  Procedure Laterality Date   HERNIA REPAIR   JOINT REPLACEMENT  Both knees   Shoulder surgery    Allergies  Allergen Reactions   Sulfa (Sulfonamide Antibiotics) Rash   Current Outpatient Medications on File Prior to Visit  Medication Sig Dispense Refill   atorvastatin (LIPITOR) 20 MG tablet Take 20 mg by mouth once daily   atorvastatin (LIPITOR) 20 MG tablet Take 20 mg by mouth once daily   atorvastatin calcium (LIPITOR ORAL) atorvastatin   No current facility-administered medications on file prior to visit.   Family History  Problem Relation Age of Onset    High blood pressure (Hypertension) Mother   Hyperlipidemia (Elevated cholesterol) Mother   Hyperlipidemia (Elevated cholesterol) Father   Heart valve disease Brother   Hyperlipidemia (Elevated cholesterol) Brother    Social History   Tobacco Use  Smoking Status Current Every Day Smoker  Smokeless Tobacco Never Used    Social History   Socioeconomic History   Marital status: Unknown  Tobacco Use   Smoking status: Current Every Day Smoker   Smokeless tobacco: Never Used  Scientific laboratory technician Use: Never used  Substance and Sexual Activity   Alcohol use: Never   Drug use: Never   Objective:   Vitals:  07/09/21 1420  BP: 120/68  Pulse: 64  Temp: 36.8 C (98.3 F)  SpO2: 98%  Weight: 70.1 kg (154 lb 9.6 oz)  Height: 171.5 cm (5' 7.5")   Body mass index is 23.86 kg/m.  Physical Exam   He appears well on exam  His abdomen is soft and nontender. He has a small, easily reducible left inguinal hernia. I did not feel a right inguinal hernia or umbilical hernia.  Lungs clear  CV RRR  Skin without rash  Neuro grossly intact    Assessment and Plan:    Left inguinal hernia    I have reviewed his notes in the electronic medical records including the notes from his primary care provider. He does have a left inguinal hernia. I discussed this with him in detail. He is well aware of hernias  given his previous repair on the other side. We discussed proceeding with an open versus a laparoscopic hernia repair with mesh. Given his prior COPD, we have decided to do this as an open repair with a tap block and LMA. I discussed the surgical procedure with him in detail. I discussed the reasons for hernia repair. I discussed the risks which includes but is not limited to bleeding, infection, injury to surrounding structures, the use of mesh, nerve entrapment, chronic pain, hernia recurrence, cardiopulmonary issues, postoperative recovery, etc. He understands and wishes to proceed  with surgery which will be scheduled.

## 2021-08-01 ENCOUNTER — Encounter (HOSPITAL_COMMUNITY): Payer: Self-pay | Admitting: Surgery

## 2021-08-01 ENCOUNTER — Ambulatory Visit (HOSPITAL_COMMUNITY): Payer: PPO | Admitting: Anesthesiology

## 2021-08-01 ENCOUNTER — Ambulatory Visit (HOSPITAL_COMMUNITY)
Admission: RE | Admit: 2021-08-01 | Discharge: 2021-08-01 | Disposition: A | Discharge status: Home / Self Care | Payer: PPO | Source: Ambulatory Visit | Attending: Surgery | Admitting: Surgery

## 2021-08-01 ENCOUNTER — Encounter (HOSPITAL_COMMUNITY)
Admission: RE | Disposition: A | Discharge status: Home / Self Care | Payer: Self-pay | Source: Ambulatory Visit | Attending: Surgery

## 2021-08-01 DIAGNOSIS — Z79899 Other long term (current) drug therapy: Secondary | ICD-10-CM | POA: Diagnosis not present

## 2021-08-01 DIAGNOSIS — Z8249 Family history of ischemic heart disease and other diseases of the circulatory system: Secondary | ICD-10-CM | POA: Diagnosis not present

## 2021-08-01 DIAGNOSIS — G8918 Other acute postprocedural pain: Secondary | ICD-10-CM | POA: Diagnosis not present

## 2021-08-01 DIAGNOSIS — K4090 Unilateral inguinal hernia, without obstruction or gangrene, not specified as recurrent: Secondary | ICD-10-CM | POA: Insufficient documentation

## 2021-08-01 DIAGNOSIS — Z8349 Family history of other endocrine, nutritional and metabolic diseases: Secondary | ICD-10-CM | POA: Diagnosis not present

## 2021-08-01 DIAGNOSIS — F17200 Nicotine dependence, unspecified, uncomplicated: Secondary | ICD-10-CM | POA: Insufficient documentation

## 2021-08-01 DIAGNOSIS — Z882 Allergy status to sulfonamides status: Secondary | ICD-10-CM | POA: Diagnosis not present

## 2021-08-01 DIAGNOSIS — J449 Chronic obstructive pulmonary disease, unspecified: Secondary | ICD-10-CM | POA: Diagnosis not present

## 2021-08-01 DIAGNOSIS — I2510 Atherosclerotic heart disease of native coronary artery without angina pectoris: Secondary | ICD-10-CM | POA: Diagnosis not present

## 2021-08-01 HISTORY — PX: INGUINAL HERNIA REPAIR: SHX194

## 2021-08-01 SURGERY — REPAIR, HERNIA, INGUINAL, ADULT
Anesthesia: General | Site: Groin | Laterality: Left

## 2021-08-01 MED ORDER — MIDAZOLAM HCL 2 MG/2ML IJ SOLN
1.0000 mg | INTRAMUSCULAR | Status: DC
  Administered 2021-08-01: 2 mg via INTRAVENOUS
  Filled 2021-08-01: qty 2

## 2021-08-01 MED ORDER — LIDOCAINE 2% (20 MG/ML) 5 ML SYRINGE
INTRAMUSCULAR | Status: DC | PRN
  Administered 2021-08-01: 50 mg via INTRAVENOUS

## 2021-08-01 MED ORDER — OXYCODONE HCL 5 MG PO TABS: 5.0000 mg | ORAL_TABLET | Freq: Once | ORAL | Status: DC | PRN

## 2021-08-01 MED ORDER — FENTANYL CITRATE (PF) 100 MCG/2ML IJ SOLN
INTRAMUSCULAR | Status: AC
  Filled 2021-08-01: qty 2

## 2021-08-01 MED ORDER — LACTATED RINGERS IV SOLN: INTRAVENOUS | Status: DC

## 2021-08-01 MED ORDER — BUPIVACAINE HCL (PF) 0.5 % IJ SOLN
INTRAMUSCULAR | Status: DC | PRN
  Administered 2021-08-01: 10 mL

## 2021-08-01 MED ORDER — EPHEDRINE SULFATE 50 MG/ML IJ SOLN
INTRAMUSCULAR | Status: DC | PRN
  Administered 2021-08-01 (×2): 10 mg via INTRAVENOUS

## 2021-08-01 MED ORDER — GLYCOPYRROLATE 0.2 MG/ML IJ SOLN
INTRAMUSCULAR | Status: AC
  Filled 2021-08-01: qty 1

## 2021-08-01 MED ORDER — FENTANYL CITRATE (PF) 100 MCG/2ML IJ SOLN: 25.0000 ug | INTRAMUSCULAR | Status: DC | PRN

## 2021-08-01 MED ORDER — BUPIVACAINE LIPOSOME 1.3 % IJ SUSP
20.0000 mL | Freq: Once | INTRAMUSCULAR | Status: DC
  Filled 2021-08-01: qty 20

## 2021-08-01 MED ORDER — TRAMADOL HCL 50 MG PO TABS: 50.0000 mg | ORAL_TABLET | Freq: Four times a day (QID) | ORAL | 0 refills | Status: DC | PRN

## 2021-08-01 MED ORDER — CHLORHEXIDINE GLUCONATE 0.12 % MT SOLN
15.0000 mL | Freq: Once | OROMUCOSAL | Status: AC
  Administered 2021-08-01: 15 mL via OROMUCOSAL

## 2021-08-01 MED ORDER — ACETAMINOPHEN 325 MG PO TABS: 325.0000 mg | ORAL_TABLET | ORAL | Status: DC | PRN

## 2021-08-01 MED ORDER — ROPIVACAINE HCL 5 MG/ML IJ SOLN
INTRAMUSCULAR | Status: DC | PRN
  Administered 2021-08-01: 30 mL

## 2021-08-01 MED ORDER — OXYCODONE HCL 5 MG/5ML PO SOLN
5.0000 mg | Freq: Once | ORAL | Status: DC | PRN
Start: 2021-08-01 — End: 2021-08-01

## 2021-08-01 MED ORDER — CEFAZOLIN SODIUM-DEXTROSE 2-4 GM/100ML-% IV SOLN
2.0000 g | INTRAVENOUS | Status: AC
  Administered 2021-08-01: 2 g via INTRAVENOUS
  Filled 2021-08-01: qty 100

## 2021-08-01 MED ORDER — ACETAMINOPHEN 160 MG/5ML PO SOLN: 325.0000 mg | ORAL | Status: DC | PRN

## 2021-08-01 MED ORDER — BUPIVACAINE HCL (PF) 0.5 % IJ SOLN
INTRAMUSCULAR | Status: AC
  Filled 2021-08-01: qty 30

## 2021-08-01 MED ORDER — DEXAMETHASONE SODIUM PHOSPHATE 10 MG/ML IJ SOLN
INTRAMUSCULAR | Status: DC | PRN
  Administered 2021-08-01: 10 mg

## 2021-08-01 MED ORDER — FENTANYL CITRATE (PF) 100 MCG/2ML IJ SOLN
50.0000 ug | INTRAMUSCULAR | Status: DC
  Administered 2021-08-01: 100 ug via INTRAVENOUS
  Filled 2021-08-01: qty 2

## 2021-08-01 MED ORDER — PROPOFOL 10 MG/ML IV BOLUS
INTRAVENOUS | Status: DC | PRN
  Administered 2021-08-01: 150 mg via INTRAVENOUS

## 2021-08-01 MED ORDER — GLYCOPYRROLATE 0.2 MG/ML IJ SOLN
INTRAMUSCULAR | Status: DC | PRN
  Administered 2021-08-01: .4 mg via INTRAVENOUS

## 2021-08-01 MED ORDER — CHLORHEXIDINE GLUCONATE CLOTH 2 % EX PADS: 6.0000 | MEDICATED_PAD | Freq: Once | CUTANEOUS | Status: DC

## 2021-08-01 MED ORDER — ORAL CARE MOUTH RINSE: 15.0000 mL | Freq: Once | OROMUCOSAL | Status: AC

## 2021-08-01 MED ORDER — EPHEDRINE 5 MG/ML INJ
INTRAVENOUS | Status: AC
  Filled 2021-08-01: qty 5

## 2021-08-01 MED ORDER — ONDANSETRON HCL 4 MG/2ML IJ SOLN: 4.0000 mg | Freq: Once | INTRAMUSCULAR | Status: DC | PRN

## 2021-08-01 MED ORDER — ONDANSETRON HCL 4 MG/2ML IJ SOLN
INTRAMUSCULAR | Status: DC | PRN
  Administered 2021-08-01: 4 mg via INTRAVENOUS

## 2021-08-01 MED ORDER — ENSURE PRE-SURGERY PO LIQD
296.0000 mL | Freq: Once | ORAL | Status: DC
  Filled 2021-08-01: qty 296

## 2021-08-01 MED ORDER — FENTANYL CITRATE (PF) 100 MCG/2ML IJ SOLN
INTRAMUSCULAR | Status: DC | PRN
  Administered 2021-08-01 (×4): 25 ug via INTRAVENOUS

## 2021-08-01 MED ORDER — DEXAMETHASONE SODIUM PHOSPHATE 4 MG/ML IJ SOLN
INTRAMUSCULAR | Status: DC | PRN
  Administered 2021-08-01: 6 mg via INTRAVENOUS

## 2021-08-01 MED ORDER — MEPERIDINE HCL 50 MG/ML IJ SOLN: 6.2500 mg | INTRAMUSCULAR | Status: DC | PRN

## 2021-08-01 SURGICAL SUPPLY — 35 items
ADH SKN CLS APL DERMABOND .7 (GAUZE/BANDAGES/DRESSINGS) ×1
APL PRP STRL LF DISP 70% ISPRP (MISCELLANEOUS) ×1
BAG COUNTER SPONGE SURGICOUNT (BAG) IMPLANT
BAG SPNG CNTER NS LX DISP (BAG)
BLADE SURG 15 STRL LF DISP TIS (BLADE) ×1 IMPLANT
BLADE SURG 15 STRL SS (BLADE) ×2
CHLORAPREP W/TINT 26 (MISCELLANEOUS) ×2 IMPLANT
DECANTER SPIKE VIAL GLASS SM (MISCELLANEOUS) IMPLANT
DERMABOND ADVANCED (GAUZE/BANDAGES/DRESSINGS) ×1
DERMABOND ADVANCED .7 DNX12 (GAUZE/BANDAGES/DRESSINGS) ×1 IMPLANT
DRAIN PENROSE 0.5X18 (DRAIN) ×2 IMPLANT
DRAPE LAPAROTOMY TRNSV 102X78 (DRAPES) ×2 IMPLANT
ELECT REM PT RETURN 15FT ADLT (MISCELLANEOUS) ×2 IMPLANT
GAUZE SPONGE 4X4 12PLY STRL (GAUZE/BANDAGES/DRESSINGS) IMPLANT
GLOVE SURG ENC MOIS LTX SZ7.5 (GLOVE) ×2 IMPLANT
GOWN STRL REUS W/TWL XL LVL3 (GOWN DISPOSABLE) ×4 IMPLANT
KIT BASIN OR (CUSTOM PROCEDURE TRAY) ×2 IMPLANT
KIT TURNOVER KIT A (KITS) ×2 IMPLANT
MESH PARIETEX PROGRIP LEFT (Mesh General) ×1 IMPLANT
NDL HYPO 25X1 1.5 SAFETY (NEEDLE) ×1 IMPLANT
NEEDLE HYPO 25X1 1.5 SAFETY (NEEDLE) ×2 IMPLANT
PACK BASIC VI WITH GOWN DISP (CUSTOM PROCEDURE TRAY) ×2 IMPLANT
PENCIL SMOKE EVACUATOR (MISCELLANEOUS) IMPLANT
SPONGE T-LAP 4X18 ~~LOC~~+RFID (SPONGE) ×2 IMPLANT
SUT MNCRL AB 4-0 PS2 18 (SUTURE) ×2 IMPLANT
SUT SILK 2 0 SH (SUTURE) ×1 IMPLANT
SUT VIC AB 2-0 CT1 27 (SUTURE)
SUT VIC AB 2-0 CT1 TAPERPNT 27 (SUTURE) IMPLANT
SUT VIC AB 3-0 54XBRD REEL (SUTURE) IMPLANT
SUT VIC AB 3-0 BRD 54 (SUTURE)
SUT VIC AB 3-0 SH 27 (SUTURE)
SUT VIC AB 3-0 SH 27XBRD (SUTURE) IMPLANT
SYR CONTROL 10ML LL (SYRINGE) ×2 IMPLANT
TOWEL OR 17X26 10 PK STRL BLUE (TOWEL DISPOSABLE) ×2 IMPLANT
TOWEL OR NON WOVEN STRL DISP B (DISPOSABLE) ×2 IMPLANT

## 2021-08-01 NOTE — Anesthesia Procedure Notes (Signed)
Anesthesia Regional Block: TAP block   Pre-Anesthetic Checklist: , timeout performed,  Correct Patient, Correct Site, Correct Laterality,  Correct Procedure, Correct Position, site marked,  Risks and benefits discussed,  Surgical consent,  Pre-op evaluation,  At surgeon's request and post-op pain management  Laterality: Left  Prep: chloraprep       Needles:  Injection technique: Single-shot  Needle Type: Echogenic Stimulator Needle     Needle Length: 5cm  Needle Gauge: 22     Additional Needles:   Procedures:, nerve stimulator,,, ultrasound used (permanent image in chart),,     Nerve Stimulator or Paresthesia:  Response: quadraceps contraction, 0.45 mA  Additional Responses:   Narrative:  Start time: 08/01/2021 8:05 AM End time: 08/01/2021 8:10 AM Injection made incrementally with aspirations every 5 mL.  Performed by: Personally  Anesthesiologist: Janeece Riggers, MD  Additional Notes: Functioning IV was confirmed and monitors were applied.  A 29m 22ga Arrow echogenic stimulator needle was used. Sterile prep and drape,hand hygiene and sterile gloves were used. Ultrasound guidance: relevant anatomy identified, needle position confirmed, local anesthetic spread visualized around nerve(s)., vascular puncture avoided.  Image printed for medical record. Negative aspiration and negative test dose prior to incremental administration of local anesthetic. The patient tolerated the procedure well.

## 2021-08-01 NOTE — Anesthesia Postprocedure Evaluation (Signed)
Anesthesia Post Note  Patient: Kevin Lozano  Procedure(s) Performed: OPEN LEFT INGUINAL HERNIA REPAIR WITH MESH (Left: Groin)     Patient location during evaluation: PACU Anesthesia Type: General Level of consciousness: awake and alert Pain management: pain level controlled Vital Signs Assessment: post-procedure vital signs reviewed and stable Respiratory status: spontaneous breathing, nonlabored ventilation, respiratory function stable and patient connected to nasal cannula oxygen Cardiovascular status: blood pressure returned to baseline and stable Postop Assessment: no apparent nausea or vomiting Anesthetic complications: no   No notable events documented.  Last Vitals:  Vitals:   08/01/21 1101 08/01/21 1115  BP: 125/73 118/69  Pulse: 66 (!) 55  Resp: 20   Temp: (!) 36.3 C   SpO2: 94% 95%    Last Pain:  Vitals:   08/01/21 1130  TempSrc:   PainSc: (P) 4                  Porchia Sinkler

## 2021-08-01 NOTE — Transfer of Care (Signed)
Immediate Anesthesia Transfer of Care Note  Patient: Kevin Lozano  Procedure(s) Performed: Procedure(s): OPEN LEFT INGUINAL HERNIA REPAIR WITH MESH (Left)  Patient Location: PACU  Anesthesia Type:General  Level of Consciousness:  sedated, patient cooperative and responds to stimulation  Airway & Oxygen Therapy:Patient Spontanous Breathing and Patient connected to face mask oxgen  Post-op Assessment:  Report given to PACU RN and Post -op Vital signs reviewed and stable  Post vital signs:  Reviewed and stable  Last Vitals:  Vitals:   08/01/21 0819 08/01/21 0820  BP:    Pulse: (!) 48 (!) 50  Resp: (!) 9 11  Temp:    SpO2: [id number]    Complications: No apparent anesthesia complications

## 2021-08-01 NOTE — Interval H&P Note (Signed)
History and Physical Interval Note:no change in H and P  08/01/2021 8:09 AM  Kevin Lozano  has presented today for surgery, with the diagnosis of LEFT INGUINAL HERNIA.  The various methods of treatment have been discussed with the patient and family. After consideration of risks, benefits and other options for treatment, the patient has consented to  Procedure(s): OPEN LEFT INGUINAL HERNIA REPAIR WITH MESH (Left) as a surgical intervention.  The patient's history has been reviewed, patient examined, no change in status, stable for surgery.  I have reviewed the patient's chart and labs.  Questions were answered to the patient's satisfaction.     Coralie Keens

## 2021-08-01 NOTE — Op Note (Signed)
Hernia, Open, Procedure Note  Indications: The patient presented with a history of a left, reducible inguinal hernia.    Pre-operative Diagnosis: left reducible inguinal hernia Post-operative Diagnosis: same  Surgeon: Coralie Keens MD  Assistants: Zacarias Pontes MD  Anesthesia: General LMA anesthesia  ASA Class: 2  Procedure Details  The patient was seen again in the Holding Room. The risks, benefits, complications, treatment options, and expected outcomes were discussed with the patient. The possibilities of reaction to medication, pulmonary aspiration, perforation of viscus, bleeding, recurrent infection, the need for additional procedures, and development of a complication requiring transfusion or further operation were discussed with the patient and/or family. The likelihood of success in repairing the hernia and returning the patient to their previous functional status is good.  There was concurrence with the proposed plan, and informed consent was obtained. The site of surgery was properly noted/marked. The patient was taken to the Operating Room, identified as Kevin Lozano, and the procedure verified as left inguinal hernia repair. A Time Out was held and the above information confirmed.  The patient was placed in the supine position and underwent induction of anesthesia. The lower abdomen and groin was prepped with Chloraprep and draped in the standard fashion, and 0.25% Marcaine with epinephrine was used to anesthetize the skin over the mid-portion of the inguinal canal. An oblique incision was made. Dissection was carried down through the subcutaneous tissue with cautery to the external oblique fascia.  We opened the external oblique fascia along the direction of its fibers to the external ring.  The spermatic cord was circumferentially dissected bluntly and retracted with a Penrose drain.  The ilioinguinal nerve was not identified.  The floor of the inguinal canal was inspected and  a direct and indirect hernia containing colon was identified. We opened the indirect hernia sac and were not able to separate the peritoneal fat from the hernia sack. We closed the hernia sack with 3-0 silk figure of eight and reduced the hernia sack through the internal ring. We used a 3-0 silk figure of eight to tighten the internal ring.  We next skeletonized the spermatic cord.  We used a 12cm Progrip mesh which was inserted and deployed across the floor of the inguinal canal. The mesh was tucked underneath the external oblique fascia laterally.  The flap of the mesh was closed around the spermatic cord to recreate the internal inguinal ring.  The mesh was secured to the pubic tubercle with 0 Vicryl.  The external oblique fascia was reapproximated with 2-0 Vicryl.  3-0 Vicryl was used to close the subcutaneous tissues and 4-0 Monocryl was used to close the skin in subcuticular fashion. Dermabond was applied to the skin incision. The patient was then extubated and brought to the recovery room in stable condition.  All sponge, instrument, and needle counts were correct prior to closure and at the conclusion of the case.   Estimated Blood Loss:  [id number]            Complications: None; patient tolerated the procedure well.         Disposition: PACU - hemodynamically stable.         Condition: stable

## 2021-08-01 NOTE — Anesthesia Procedure Notes (Signed)
Procedure Name: LMA Insertion Date/Time: 08/01/2021 9:00 AM Performed by: Lavina Hamman, CRNA Pre-anesthesia Checklist: Patient identified, Emergency Drugs available, Suction available and Patient being monitored Patient Re-evaluated:Patient Re-evaluated prior to induction Oxygen Delivery Method: Circle System Utilized Preoxygenation: Pre-oxygenation with 100% oxygen Induction Type: IV induction Ventilation: Mask ventilation without difficulty LMA: LMA with gastric port inserted LMA Size: 4.0 Number of attempts: 1 Airway Equipment and Method: Bite block Placement Confirmation: positive ETCO2 Tube secured with: Tape Dental Injury: Teeth and Oropharynx as per pre-operative assessment

## 2021-08-01 NOTE — Progress Notes (Signed)
Assisted Dr. Ambrose Pancoast with left, ultrasound guided tap block. Side rails up, monitors on throughout procedure. See vital signs in flow sheet. Tolerated Procedure well.

## 2021-08-01 NOTE — Discharge Instructions (Signed)
[facility] _______[facility]  UMBILICAL OR INGUINAL HERNIA REPAIR: POST OP INSTRUCTIONS  Always review your discharge instruction sheet given to you by the facility where your surgery was performed. IF YOU HAVE DISABILITY OR FAMILY LEAVE FORMS, YOU MUST BRING THEM TO THE OFFICE FOR PROCESSING.   DO NOT GIVE THEM TO YOUR DOCTOR.  1. A  prescription for pain medication may be given to you upon discharge.  Take your pain medication as prescribed, if needed.  If narcotic pain medicine is not needed, then you may take acetaminophen (Tylenol) or ibuprofen (Advil) as needed. 2. Take your usually prescribed medications unless otherwise directed. If you need a refill on your pain medication, please contact your pharmacy.  They will contact our office to request authorization. Prescriptions will not be filled after 5 pm or on week-ends. 3. You should follow a light diet the first 24 hours after arrival home, such as soup and crackers, etc.  Be sure to include lots of fluids daily.  Resume your normal diet the day after surgery. 4.Most patients will experience some swelling and bruising around the umbilicus or in the groin and scrotum.  Ice packs and reclining will help.  Swelling and bruising can take several days to resolve.  6. It is common to experience some constipation if taking pain medication after surgery.  Increasing fluid intake and taking a stool softener (such as Colace) will usually help or prevent this problem from occurring.  A mild laxative (Milk of Magnesia or Miralax) should be taken according to package directions if there are no bowel movements after 48 hours. 7. Unless discharge instructions indicate otherwise, you may remove your bandages 24-48 hours after surgery, and you may shower at that time.  You may have steri-strips (small skin tapes) in place directly over the incision.  These strips should be left on the skin for 7-10 days.  If your surgeon used skin glue on the  incision, you may shower in 24 hours.  The glue will flake off over the next 2-3 weeks.  Any sutures or staples will be removed at the office during your follow-up visit. 8. ACTIVITIES:  You may resume regular (light) daily activities beginning the next day--such as daily self-care, walking, climbing stairs--gradually increasing activities as tolerated.  You may have sexual intercourse when it is comfortable.  Refrain from any heavy lifting or straining until approved by your doctor.  a.You may drive when you are no longer taking prescription pain medication, you can comfortably wear a seatbelt, and you can safely maneuver your car and apply brakes. b.RETURN TO WORK:   _____________________________________________  9.You should see your doctor in the office for a follow-up appointment approximately 2-3 weeks after your surgery.  Make sure that you call for this appointment within a day or two after you arrive home to insure a convenient appointment time. 10.OTHER INSTRUCTIONS: _OK TO SHOWER STARTING TOMORROW ICE PACK, TYLENOL, AND IBUPROFEN ALSO FOR PAIN NO LIFTING MORE THAN 15 POUNDS FOR 4 WEEKS________________________    _____________________________________  WHEN TO CALL YOUR DOCTOR: Fever over 101.0 Inability to urinate Nausea and/or vomiting Extreme swelling or bruising Continued bleeding from incision. Increased pain, redness, or drainage from the incision  The clinic staff is available to answer your questions during regular business hours.  Please don't hesitate to call and ask to speak to one of the nurses for clinical concerns.  If you have a medical emergency, go to the nearest emergency room or call 911.    A surgeon from [facility] is always on call at the hospital   [address] [place], [place], [place]  [address] ?  [address], [place], [place]   [address] [phone] ? [phone] ? FAX [phone] Web site: [email or web address]

## 2021-08-01 NOTE — Anesthesia Preprocedure Evaluation (Addendum)
Anesthesia Evaluation  Patient identified by MRN, date of birth, ID band Patient awake    Reviewed: Allergy & Precautions, H&P , NPO status , Patient's Chart, lab work & pertinent test results, reviewed documented beta blocker date and time   Airway Mallampati: I  TM Distance: >3 FB Neck ROM: full    Dental no notable dental hx. (+) Edentulous Upper, Edentulous Lower   Pulmonary COPD, Current Smoker and Patient abstained from smoking.,    Pulmonary exam normal breath sounds clear to auscultation       Cardiovascular Exercise Tolerance: Good + CAD   Rhythm:regular Rate:Normal     Neuro/Psych negative neurological ROS  negative psych ROS   GI/Hepatic negative GI ROS, Neg liver ROS,   Endo/Other  negative endocrine ROS  Renal/GU negative Renal ROS  negative genitourinary   Musculoskeletal  (+) Arthritis , Osteoarthritis,    Abdominal   Peds  Hematology negative hematology ROS (+)   Anesthesia Other Findings   Reproductive/Obstetrics negative OB ROS                            Anesthesia Physical Anesthesia Plan  ASA: 2  Anesthesia Plan: General   Post-op Pain Management: GA combined w/ Regional for post-op pain   Induction: Intravenous  PONV Risk Score and Plan: 2 and Ondansetron and Dexamethasone  Airway Management Planned: Oral ETT and LMA  Additional Equipment: None  Intra-op Plan:   Post-operative Plan: Extubation in OR  Informed Consent: I have reviewed the patients History and Physical, chart, labs and discussed the procedure including the risks, benefits and alternatives for the proposed anesthesia with the patient or authorized representative who has indicated his/her understanding and acceptance.     Dental Advisory Given  Plan Discussed with: CRNA and Anesthesiologist  Anesthesia Plan Comments: (Discussed both nerve block for pain relief post-op and GA; including  NV, sore throat, dental injury, and pulmonary complications)        Anesthesia Quick Evaluation

## 2021-08-02 ENCOUNTER — Encounter (HOSPITAL_COMMUNITY): Payer: Self-pay | Admitting: Surgery

## 2021-09-25 IMAGING — CT CT CHEST LUNG CANCER SCREENING LOW DOSE W/O CM
1 series · 14 of 33 positions shown, 18 images · non-contrast
Comparison: No priors.

CLINICAL DATA: 74-year-old male current smoker with 31 pack-year
history of smoking. Lung cancer screening examination.

EXAM:
CT CHEST WITHOUT CONTRAST LOW-DOSE FOR LUNG CANCER SCREENING
TECHNIQUE: Multidetector CT imaging of the chest was performed following the
standard protocol without IV contrast.

[Series 2: ldct screening <30 bmi · axial · 0.62mm/px · z∈[-348,-32]mm · 14 of 75 slices shown, 18 images]
[im 6/75  mediastinal]
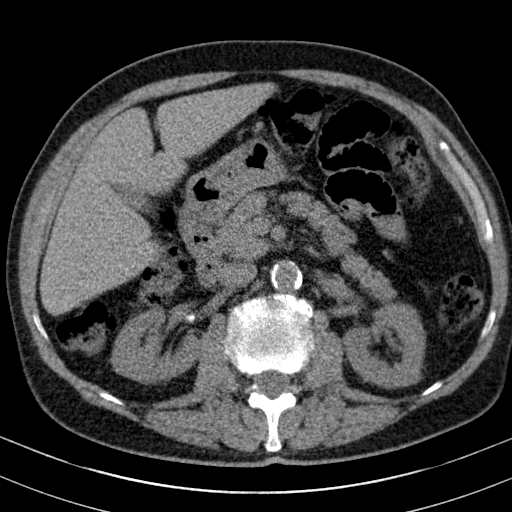
[im 6/75  lung]
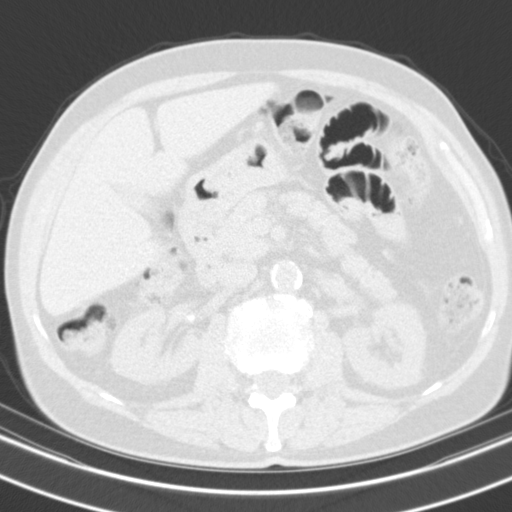
[im 11/75  lung]
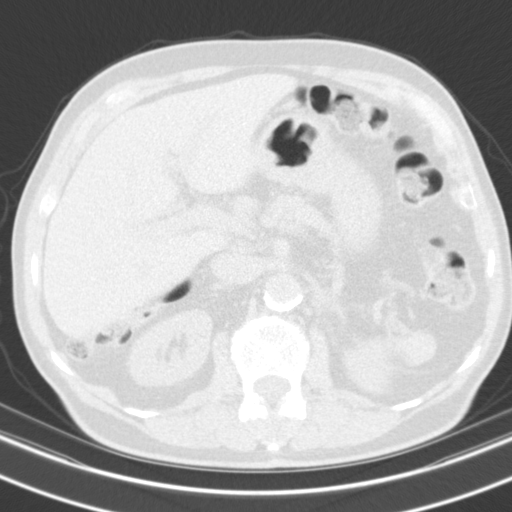
[im 15/75  lung]
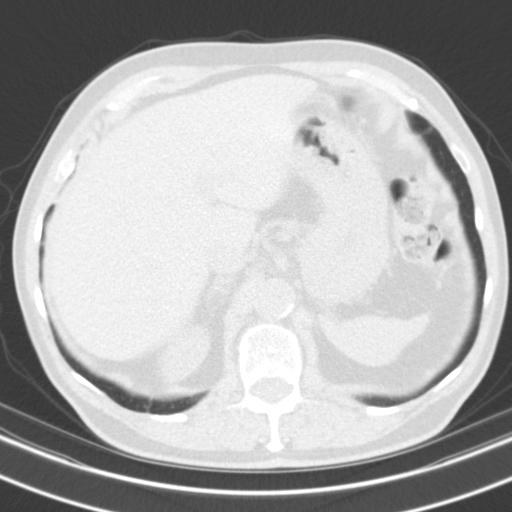
[im 20/75  lung]
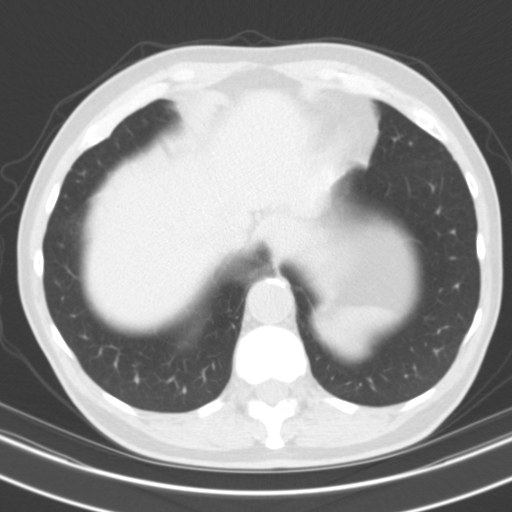
[im 25/75  mediastinal]
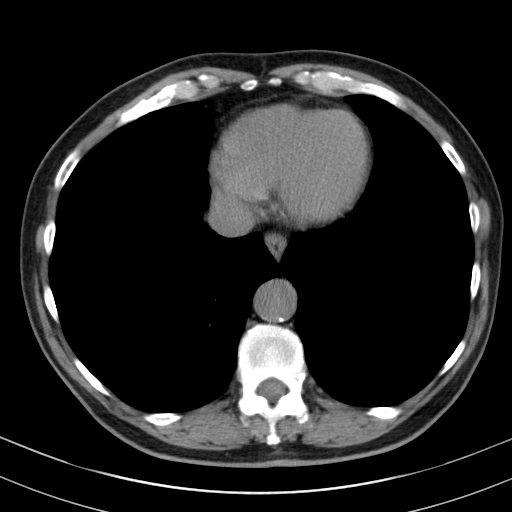
[im 25/75  lung]
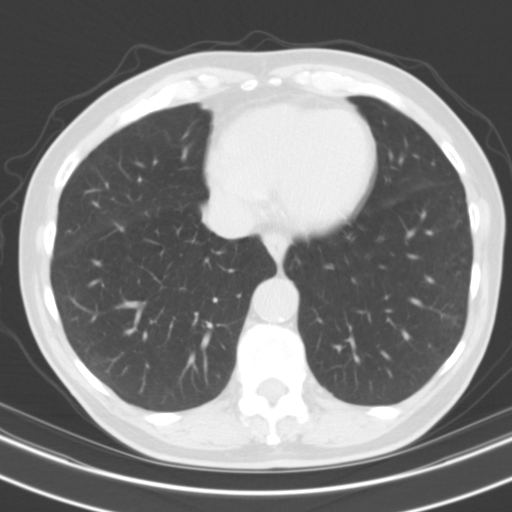
[im 31/75  lung]
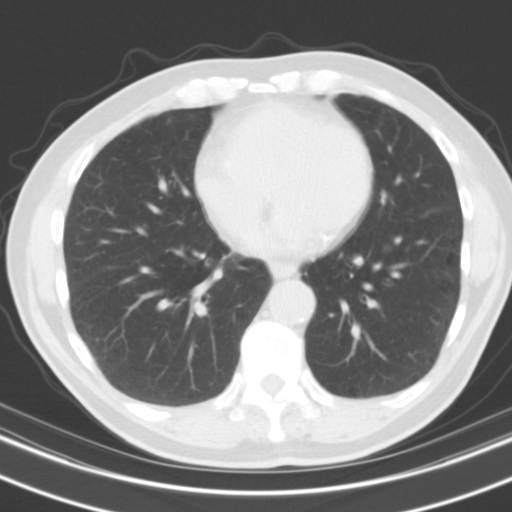
[im 36/75  lung]
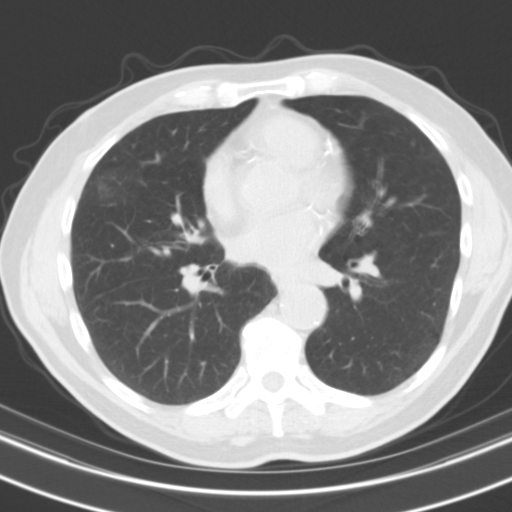
[im 40/75  lung]
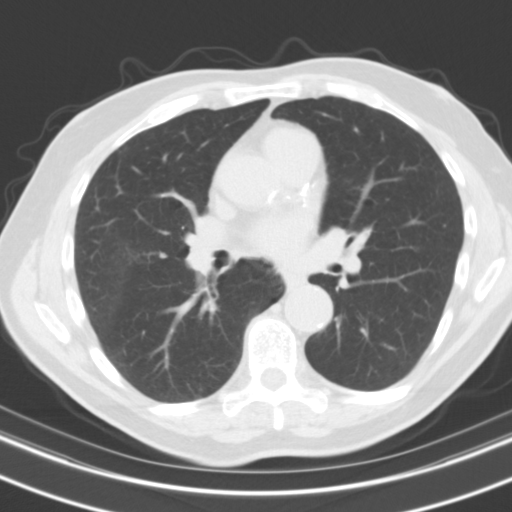
[im 44/75  mediastinal]
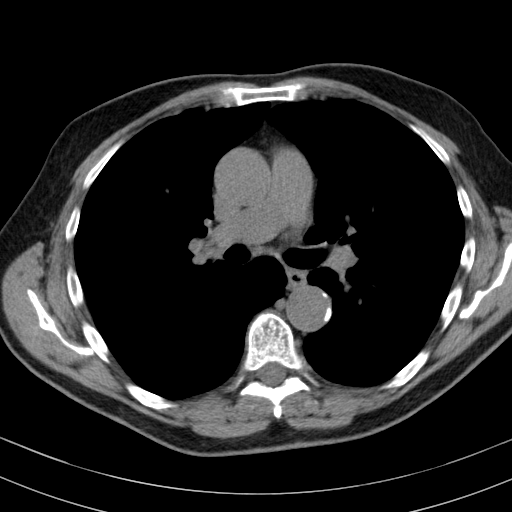
[im 44/75  lung]
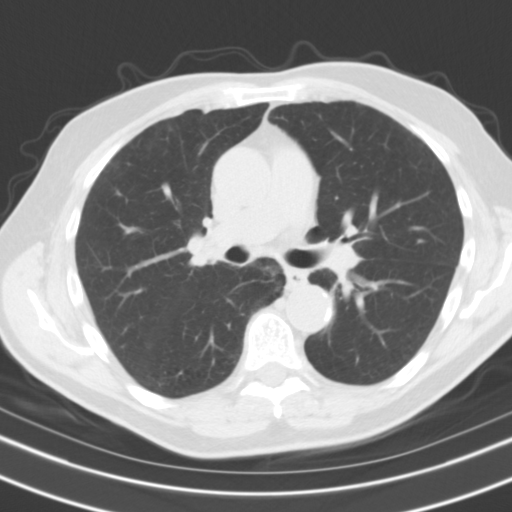
[im 50/75  lung]
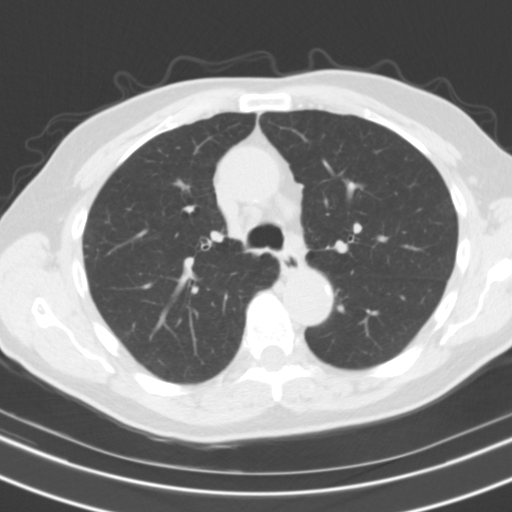
[im 55/75  lung]
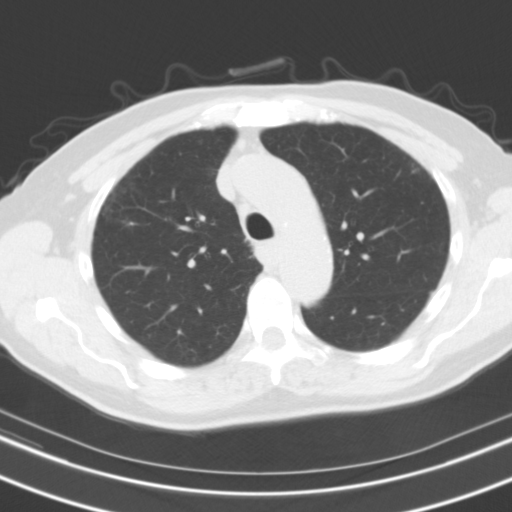
[im 60/75  lung]
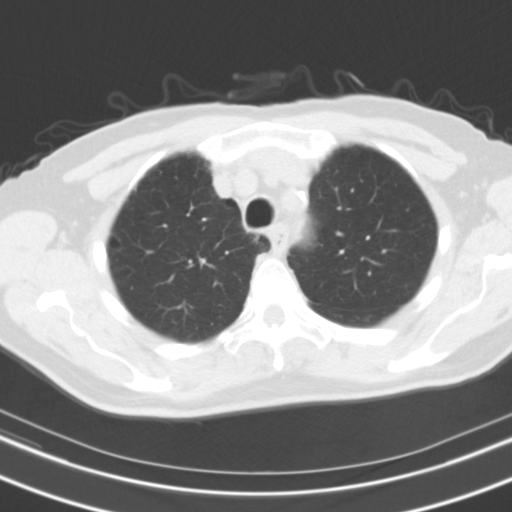
[im 64/75  mediastinal]
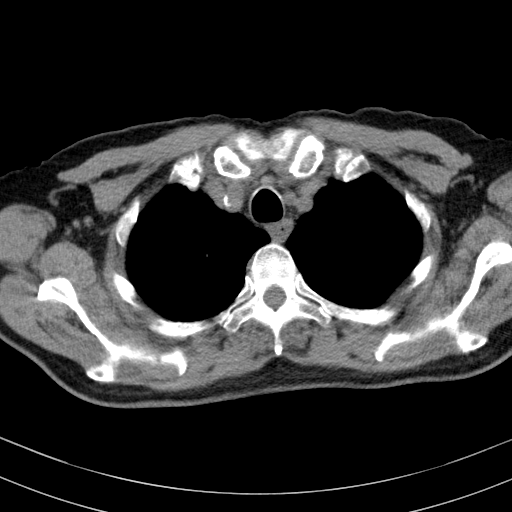
[im 64/75  lung]
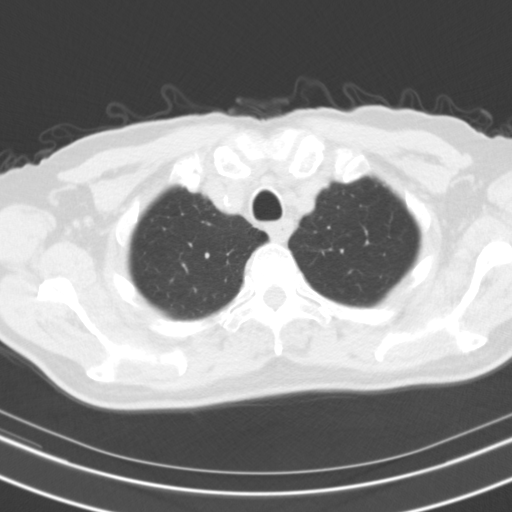
[im 69/75  lung]
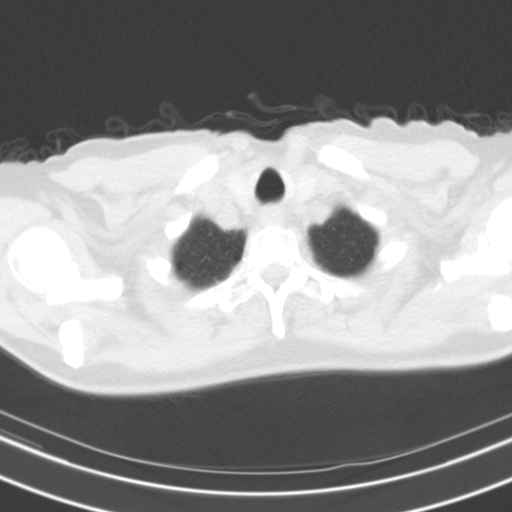

[14 of 33 positions shown; findings below may reference images not displayed]

FINDINGS: Cardiovascular: Heart size is normal. There is no significant
pericardial fluid, thickening or pericardial calcification. There is
aortic atherosclerosis, as well as atherosclerosis of the great
vessels of the mediastinum and the coronary arteries, including
calcified atherosclerotic plaque in the left main, left anterior
descending, left circumflex and right coronary arteries.

Mediastinum/Nodes: No pathologically enlarged mediastinal or hilar
lymph nodes. Please note that accurate exclusion of hilar adenopathy
is limited on noncontrast CT scans. Esophagus is unremarkable in
appearance. No axillary lymphadenopathy.

Lungs/Pleura: Multiple tiny pulmonary nodules are scattered
throughout the lungs bilaterally, largest of which is in the
periphery of the left upper lobe (axial image 121 of series 3), with
a volume derived mean diameter of 2.7 mm. No larger more suspicious
appearing pulmonary nodules or masses are noted. No acute
consolidative airspace disease. No pleural effusions. Diffuse
bronchial wall thickening with mild centrilobular and paraseptal
emphysema.

Upper Abdomen: Aortic atherosclerosis.

Musculoskeletal: There are no aggressive appearing lytic or blastic
lesions noted in the visualized portions of the skeleton.
IMPRESSION: 1. Lung-RADS 2S, benign appearance or behavior. Continue annual
screening with low-dose chest CT without contrast in 12 months.
2. The "S" modifier above refers to potentially clinically
significant non lung cancer related findings. Specifically, there is
aortic atherosclerosis, in addition to left main and 3 vessel
coronary artery disease. Please note that although the presence of
coronary artery calcium documents the presence of coronary artery
disease, the severity of this disease and any potential stenosis
cannot be assessed on this non-gated CT examination. Assessment for
potential risk factor modification, dietary therapy or pharmacologic
therapy may be warranted, if clinically indicated.
3. Mild diffuse bronchial wall thickening with mild centrilobular
and paraseptal emphysema; imaging findings suggestive of underlying
COPD.

Aortic Atherosclerosis (3QHJU-3IV.V) and Emphysema (3QHJU-8B8.6).

## 2022-02-21 DIAGNOSIS — M150 Primary generalized (osteo)arthritis: Secondary | ICD-10-CM | POA: Diagnosis not present

## 2022-02-21 DIAGNOSIS — Z0000 Encounter for general adult medical examination without abnormal findings: Secondary | ICD-10-CM | POA: Diagnosis not present

## 2022-02-21 DIAGNOSIS — F17200 Nicotine dependence, unspecified, uncomplicated: Secondary | ICD-10-CM | POA: Diagnosis not present

## 2022-02-21 DIAGNOSIS — Z125 Encounter for screening for malignant neoplasm of prostate: Secondary | ICD-10-CM | POA: Diagnosis not present

## 2022-02-21 DIAGNOSIS — K219 Gastro-esophageal reflux disease without esophagitis: Secondary | ICD-10-CM | POA: Diagnosis not present

## 2022-02-21 DIAGNOSIS — E7800 Pure hypercholesterolemia, unspecified: Secondary | ICD-10-CM | POA: Diagnosis not present

## 2022-03-14 ENCOUNTER — Other Ambulatory Visit: Payer: Self-pay | Admitting: Acute Care

## 2022-03-14 DIAGNOSIS — F17210 Nicotine dependence, cigarettes, uncomplicated: Secondary | ICD-10-CM

## 2022-03-14 DIAGNOSIS — Z87891 Personal history of nicotine dependence: Secondary | ICD-10-CM

## 2022-03-19 DIAGNOSIS — H524 Presbyopia: Secondary | ICD-10-CM | POA: Diagnosis not present

## 2022-03-19 DIAGNOSIS — Z961 Presence of intraocular lens: Secondary | ICD-10-CM | POA: Diagnosis not present

## 2022-03-19 DIAGNOSIS — H02824 Cysts of left upper eyelid: Secondary | ICD-10-CM | POA: Diagnosis not present

## 2022-03-19 DIAGNOSIS — H16263 Vernal keratoconjunctivitis, with limbar and corneal involvement, bilateral: Secondary | ICD-10-CM | POA: Diagnosis not present

## 2022-03-19 DIAGNOSIS — H43813 Vitreous degeneration, bilateral: Secondary | ICD-10-CM | POA: Diagnosis not present

## 2022-03-28 ENCOUNTER — Ambulatory Visit
Admission: RE | Admit: 2022-03-28 | Discharge: 2022-03-28 | Disposition: A | Discharge status: Home / Self Care | Payer: PPO | Source: Ambulatory Visit | Attending: Acute Care | Admitting: Acute Care

## 2022-03-28 DIAGNOSIS — F17210 Nicotine dependence, cigarettes, uncomplicated: Secondary | ICD-10-CM

## 2022-03-28 DIAGNOSIS — I2510 Atherosclerotic heart disease of native coronary artery without angina pectoris: Secondary | ICD-10-CM | POA: Diagnosis not present

## 2022-03-28 DIAGNOSIS — I700 Atherosclerosis of aorta: Secondary | ICD-10-CM | POA: Diagnosis not present

## 2022-03-28 DIAGNOSIS — J439 Emphysema, unspecified: Secondary | ICD-10-CM | POA: Diagnosis not present

## 2022-03-28 DIAGNOSIS — Z87891 Personal history of nicotine dependence: Secondary | ICD-10-CM | POA: Diagnosis not present

## 2022-03-31 ENCOUNTER — Other Ambulatory Visit: Payer: Self-pay | Admitting: Acute Care

## 2022-03-31 DIAGNOSIS — Z122 Encounter for screening for malignant neoplasm of respiratory organs: Secondary | ICD-10-CM

## 2022-03-31 DIAGNOSIS — F17210 Nicotine dependence, cigarettes, uncomplicated: Secondary | ICD-10-CM

## 2022-03-31 DIAGNOSIS — Z87891 Personal history of nicotine dependence: Secondary | ICD-10-CM

## 2022-04-16 DIAGNOSIS — D485 Neoplasm of uncertain behavior of skin: Secondary | ICD-10-CM | POA: Diagnosis not present

## 2022-05-08 DIAGNOSIS — D485 Neoplasm of uncertain behavior of skin: Secondary | ICD-10-CM | POA: Diagnosis not present

## 2022-05-08 DIAGNOSIS — L820 Inflamed seborrheic keratosis: Secondary | ICD-10-CM | POA: Diagnosis not present

## 2022-05-27 DIAGNOSIS — R059 Cough, unspecified: Secondary | ICD-10-CM | POA: Diagnosis not present

## 2022-05-27 DIAGNOSIS — J40 Bronchitis, not specified as acute or chronic: Secondary | ICD-10-CM | POA: Diagnosis not present

## 2022-05-27 DIAGNOSIS — R5383 Other fatigue: Secondary | ICD-10-CM | POA: Diagnosis not present

## 2022-08-21 DIAGNOSIS — K648 Other hemorrhoids: Secondary | ICD-10-CM | POA: Diagnosis not present

## 2022-08-21 DIAGNOSIS — Z86010 Personal history of colonic polyps: Secondary | ICD-10-CM | POA: Diagnosis not present

## 2022-08-21 DIAGNOSIS — Z09 Encounter for follow-up examination after completed treatment for conditions other than malignant neoplasm: Secondary | ICD-10-CM | POA: Diagnosis not present

## 2022-08-21 DIAGNOSIS — K5730 Diverticulosis of large intestine without perforation or abscess without bleeding: Secondary | ICD-10-CM | POA: Diagnosis not present

## 2022-08-21 DIAGNOSIS — K635 Polyp of colon: Secondary | ICD-10-CM | POA: Diagnosis not present

## 2022-08-26 DIAGNOSIS — K635 Polyp of colon: Secondary | ICD-10-CM | POA: Diagnosis not present

## 2022-08-28 NOTE — Progress Notes (Signed)
Office Visit    Patient Name: Kevin Lozano Date of Encounter: 08/29/2022  Primary Care Provider:  Shirline Frees, MD Primary Cardiologist:  Larae Grooms, MD Primary Electrophysiologist: None  Chief Complaint    Kevin Lozano is a 76 y.o. male with PMH of coronary calcifications, hyperlipidemia, tobacco abuse COPD presents today for 1 year follow-up of coronary calcification.  Past Medical History    Past Medical History:  Diagnosis Date   Arthritis    COPD (chronic obstructive pulmonary disease) ([place])    Coronary artery calcification    Past Surgical History:  Procedure Laterality Date   HERNIA REPAIR     INGUINAL HERNIA REPAIR Left 08/01/2021   Procedure: OPEN LEFT INGUINAL HERNIA REPAIR WITH MESH;  Surgeon: Coralie Keens, MD;  Location: [facility];  Service: General;  Laterality: Left;   ROTATOR CUFF REPAIR Right    TOTAL KNEE ARTHROPLASTY Bilateral    WISDOM TOOTH EXTRACTION      Allergies  Allergies  Allergen Reactions   Sulfa Antibiotics Rash    History of Present Illness    Kevin Lozano is a 76 year old male with the above-mentioned past medical history who presents for 1 year follow-up.  He was seen initially by Dr.Varanasi in 2022 after referral from PCP following calcification seen on chest CT.  During visit patient denied chest pain syncope or shortness of breath.  He is active and plays golf and walks without using golf cart.  He had a normal stress test done several years ago and currently had no complaints.  He was started on ASA 81 mg and advised to stop smoking and continue atorvastatin for hyperlipidemia.  Kevin Lozano presents today for annual follow-up alone.  Since last being seen in the office patient reports he has been doing well with no cardiac complaints.  He is still playing lots of golf and is currently smoking half a pack of cigarettes per day.  He denies any myalgias with his atorvastatin and he is compliant with his other medications.   We discussed smoking cessation and I advised that when he is ready to stop we can assist with medications and counseling.  Patient denies chest pain, palpitations, dyspnea, PND, orthopnea, nausea, vomiting, dizziness, syncope, edema, weight gain, or early satiety.   Home Medications    Current Outpatient Medications  Medication Sig Dispense Refill   acetaminophen (TYLENOL) 650 MG CR tablet Take 1,300 mg by mouth every 8 (eight) hours as needed for pain.     aspirin EC 81 MG tablet Take 1 tablet (81 mg total) by mouth daily. Swallow whole. 90 tablet 3   atorvastatin (LIPITOR) 20 MG tablet Take 20 mg by mouth daily.     No current facility-administered medications for this visit.     Review of Systems  Please see the history of present illness.     All other systems reviewed and are otherwise negative except as noted above.  Physical Exam    Wt Readings from Last 3 Encounters:  08/29/22 153 lb 12.8 oz (69.8 kg)  08/01/21 153 lb (69.4 kg)  07/24/21 153 lb (69.4 kg)   VS: Vitals:   08/29/22 1443  BP: 138/80  Pulse: 62  SpO2: 98%  ,Body mass index is 23.73 kg/m.  Constitutional:      Appearance: Healthy appearance. Not in distress.  Neck:     Vascular: JVD normal.  Pulmonary:     Effort: Pulmonary effort is normal.     Breath sounds:  No wheezing. No rales. Diminished in the bases Cardiovascular:     Normal rate. Regular rhythm. Normal S1. Normal S2.      Murmurs: There is no murmur.  Edema:    Peripheral edema absent.  Abdominal:     Palpations: Abdomen is soft non tender. There is no hepatomegaly.  Skin:    General: Skin is warm and dry.  Neurological:     General: No focal deficit present.     Mental Status: Alert and oriented to person, place and time.     Cranial Nerves: Cranial nerves are intact.  EKG/LABS/Other Studies Reviewed    ECG personally reviewed by me today -sinus rhythm with possible left atrial enlargement with rate of 62 bpm and no acute changes  compared to previous EKG.   Lab Results  Component Value Date   WBC 10.6 (H) 07/24/2021   HGB 14.6 07/24/2021   HCT 43.0 07/24/2021   MCV 94.1 07/24/2021   PLT 244 07/24/2021   Lab Results  Component Value Date   CREATININE 0.81 11/14/2010   BUN 5 (L) 11/14/2010   NA 135 11/14/2010   K 3.5 11/14/2010   CL 101 11/14/2010   CO2 27 11/14/2010   No results found for: "ALT", "AST", "GGT", "ALKPHOS", "BILITOT" No results found for: "CHOL", "HDL", "LDLCALC", "LDLDIRECT", "TRIG", "CHOLHDL"  No results found for: "HGBA1C"  Assessment & Plan    1.  Coronary calcifications: -Aortic atherosclerosis discovered lung cancer screening CT -Continue ASA 81 mg and Lipitor 20 mg  2.  Hyperlipidemia: -Patient's last LDL was 94 -Continue atorvastatin 20 mg daily -Patient advised to adhere to a plant-based whole food diet and reduce intake of carbohydrates and sweets  3.  Tobacco abuse: -Smoking cessation advised and patient is still smoking half a pack per day -He was advised about the importance of smoking cessation and preventing progression of cardiovascular disease.      Disposition: Follow-up with Larae Grooms, MD or APP in 12 months    Medication Adjustments/Labs and Tests Ordered: Current medicines are reviewed at length with the patient today.  Concerns regarding medicines are outlined above.   Signed, Mable Fill, Marissa Nestle, NP 08/29/2022, 3:31 PM [place]

## 2022-08-29 ENCOUNTER — Ambulatory Visit: Payer: PPO | Attending: Nurse Practitioner | Admitting: Nurse Practitioner

## 2022-08-29 ENCOUNTER — Encounter: Payer: Self-pay | Admitting: Nurse Practitioner

## 2022-08-29 VITALS — BP 138/80 | HR 62 | Ht 67.5 in | Wt 153.8 lb

## 2022-08-29 DIAGNOSIS — E782 Mixed hyperlipidemia: Secondary | ICD-10-CM

## 2022-08-29 DIAGNOSIS — I2510 Atherosclerotic heart disease of native coronary artery without angina pectoris: Secondary | ICD-10-CM

## 2022-08-29 DIAGNOSIS — Z720 Tobacco use: Secondary | ICD-10-CM

## 2022-08-29 NOTE — Patient Instructions (Signed)
Medication Instructions:   Your physician recommends that you continue on your current medications as directed. Please refer to the Current Medication list given to you today.   *If you need a refill on your cardiac medications before your next appointment, please call your pharmacy*   Lab Work:  None ordered.  If you have labs (blood work) drawn today and your tests are completely normal, you will receive your results only by: [place] (if you have MyChart) OR A paper copy in the mail If you have any lab test that is abnormal or we need to change your treatment, we will call you to review the results.   Testing/Procedures:  None ordered.   Follow-Up: At [facility], you and your health needs are our priority.  As part of our continuing mission to provide you with exceptional heart care, we have created designated Provider Care Teams.  These Care Teams include your primary Cardiologist (physician) and Advanced Practice Providers (APPs -  Physician Assistants and Nurse Practitioners) who all work together to provide you with the care you need, when you need it.  We recommend signing up for the patient portal called "MyChart".  Sign up information is provided on this After Visit Summary.  MyChart is used to connect with patients for Virtual Visits (Telemedicine).  Patients are able to view lab/test results, encounter notes, upcoming appointments, etc.  Non-urgent messages can be sent to your provider as well.   To learn more about what you can do with MyChart, go to [email or web address].    Your next appointment:   1 year(s)  The format for your next appointment:   In Person  Provider:   Larae Grooms, MD     Other Instructions  Your physician wants you to follow-up in: 1 year with Dr.Varanasi. You will receive a reminder letter in the mail two months in advance. If you don't receive a letter, please call our office to schedule the follow-up  appointment.   Important Information About Sugar

## 2022-10-11 DIAGNOSIS — T148XXA Other injury of unspecified body region, initial encounter: Secondary | ICD-10-CM | POA: Diagnosis not present

## 2022-10-11 DIAGNOSIS — Z792 Long term (current) use of antibiotics: Secondary | ICD-10-CM | POA: Diagnosis not present

## 2022-10-20 DIAGNOSIS — J069 Acute upper respiratory infection, unspecified: Secondary | ICD-10-CM | POA: Diagnosis not present

## 2023-02-21 ENCOUNTER — Other Ambulatory Visit: Payer: Self-pay | Admitting: Acute Care

## 2023-02-21 DIAGNOSIS — Z87891 Personal history of nicotine dependence: Secondary | ICD-10-CM

## 2023-02-21 DIAGNOSIS — F17210 Nicotine dependence, cigarettes, uncomplicated: Secondary | ICD-10-CM

## 2023-02-21 DIAGNOSIS — Z122 Encounter for screening for malignant neoplasm of respiratory organs: Secondary | ICD-10-CM

## 2023-02-25 ENCOUNTER — Ambulatory Visit (INDEPENDENT_AMBULATORY_CARE_PROVIDER_SITE_OTHER): Payer: PPO

## 2023-02-25 ENCOUNTER — Ambulatory Visit (INDEPENDENT_AMBULATORY_CARE_PROVIDER_SITE_OTHER): Payer: PPO | Admitting: Surgical

## 2023-02-25 DIAGNOSIS — M25512 Pain in left shoulder: Secondary | ICD-10-CM

## 2023-02-26 ENCOUNTER — Encounter: Payer: Self-pay | Admitting: Surgical

## 2023-02-26 NOTE — Progress Notes (Signed)
Office Visit Note   Patient: Kevin Lozano           Date of Birth: [birth year]           MRN: [id number] Visit Date: 02/25/2023 Requested by: Shirline Frees, MD [address],  [place] [address] PCP: Shirline Frees, MD  Subjective: Chief Complaint  Patient presents with   Left Shoulder - Pain    HPI: Kevin Lozano is a 77 y.o. male who presents to the office reporting left shoulder pain.  Patient was last seen in November 2021 s/p right shoulder rotator cuff tear repair.  Did well at that time.  He states that he has had worsening pain of the left shoulder over the last several months with severe pain over the last 3 to 4 weeks.  No history of prior injury.  He did have left shoulder subacromial injection in September 2021 that provided good relief for him.  He describes diffuse left shoulder and lateral shoulder pain without radiation.  No numbness or tingling, radicular pain, scapular pain, neck pain.  No history of prior surgery to the left shoulder.  Has occasional popping sensation.  He is right-hand dominant.  He has pain that wakes him up at night most nights.  He also has difficulty with his golf swing that is new for him.  No diabetes.  He does have a history of smoking..                ROS: All systems reviewed are negative as they relate to the chief complaint within the history of present illness.  Patient denies fevers or chills.  Assessment & Plan: Visit Diagnoses:  1. Left shoulder pain, unspecified chronicity     Plan: Patient is a 77 year old male who presents for evaluation of left shoulder pain.  He has worsening pain in particular over the last month without any history of injury.  He has nighttime pain and pain with golf swing.  He also has demonstrable rotator cuff weakness on exam today, particularly with infraspinatus.  With his history of prior rotator cuff pathology that needed surgical intervention and worsening left shoulder pain with  symptoms that are suspicious for rotator cuff pathology, plan for further evaluation with MRI arthrogram of the left shoulder.  Follow-up after MRI to review results with Dr. Marlou Sa.  Follow-Up Instructions: No follow-ups on file.   Orders:  Orders Placed This Encounter  Procedures   XR Shoulder Left   MR Shoulder Left w/ contrast   Arthrogram   No orders of the defined types were placed in this encounter.     Procedures: No procedures performed   Clinical Data: No additional findings.  Objective: Vital Signs: There were no vitals taken for this visit.  Physical Exam:  Constitutional: Patient appears well-developed HEENT:  Head: Normocephalic Eyes:EOM are normal Neck: Normal range of motion Cardiovascular: Normal rate Pulmonary/chest: Effort normal Neurologic: Patient is alert Skin: Skin is warm Psychiatric: Patient has normal mood and affect  Ortho Exam: Ortho exam demonstrates right shoulder with 50 degrees X rotation, 90 degrees abduction, 160 degrees forward elevation.  This compared with the left shoulder with 50 degrees X rotation, 100 degrees abduction, 150 degrees forward elevation.  He has excellent rotator cuff strength of the right shoulder rated 5/5.  He also has excellent subscap strength of the left shoulder rated 5/5.  Infraspinatus strength rated 4/5.  Supraspinatus strength rated 5 -/5.  He has popping and crepitus  noted with passive motion of the shoulder suspicious for rotator cuff pathology.  He has no tenderness over the [facility] joint or acromion.  No cellulitis or ecchymosis noted.  He has no Popeye deformity noted.  Negative external rotation lag sign.  Negative Hornblower sign.  Specialty Comments:  No specialty comments available.  Imaging: No results found.   PMFS History: There are no problems to display for this patient.  Past Medical History:  Diagnosis Date   Arthritis    COPD (chronic obstructive pulmonary disease) ([place])    Coronary artery  calcification     Family History  Problem Relation Age of Onset   Hyperlipidemia Mother    Hyperlipidemia Father     Past Surgical History:  Procedure Laterality Date   HERNIA REPAIR     INGUINAL HERNIA REPAIR Left 08/01/2021   Procedure: OPEN LEFT INGUINAL HERNIA REPAIR WITH MESH;  Surgeon: Coralie Keens, MD;  Location: [facility];  Service: General;  Laterality: Left;   ROTATOR CUFF REPAIR Right    TOTAL KNEE ARTHROPLASTY Bilateral    WISDOM TOOTH EXTRACTION     Social History   Occupational History   Not on file  Tobacco Use   Smoking status: Every Day    Packs/day: 0.50    Types: Cigarettes   Smokeless tobacco: Never   Tobacco comments:    half pack daily-03/20/21-AH  Vaping Use   Vaping Use: Never used  Substance and Sexual Activity   Alcohol use: Never   Drug use: Never   Sexual activity: Not on file

## 2023-03-13 DIAGNOSIS — F17200 Nicotine dependence, unspecified, uncomplicated: Secondary | ICD-10-CM | POA: Diagnosis not present

## 2023-03-13 DIAGNOSIS — Z125 Encounter for screening for malignant neoplasm of prostate: Secondary | ICD-10-CM | POA: Diagnosis not present

## 2023-03-13 DIAGNOSIS — K219 Gastro-esophageal reflux disease without esophagitis: Secondary | ICD-10-CM | POA: Diagnosis not present

## 2023-03-13 DIAGNOSIS — J439 Emphysema, unspecified: Secondary | ICD-10-CM | POA: Diagnosis not present

## 2023-03-13 DIAGNOSIS — H6123 Impacted cerumen, bilateral: Secondary | ICD-10-CM | POA: Diagnosis not present

## 2023-03-13 DIAGNOSIS — I700 Atherosclerosis of aorta: Secondary | ICD-10-CM | POA: Diagnosis not present

## 2023-03-13 DIAGNOSIS — E7800 Pure hypercholesterolemia, unspecified: Secondary | ICD-10-CM | POA: Diagnosis not present

## 2023-03-13 DIAGNOSIS — M150 Primary generalized (osteo)arthritis: Secondary | ICD-10-CM | POA: Diagnosis not present

## 2023-03-13 DIAGNOSIS — Z0000 Encounter for general adult medical examination without abnormal findings: Secondary | ICD-10-CM | POA: Diagnosis not present

## 2023-03-18 ENCOUNTER — Ambulatory Visit
Admission: RE | Admit: 2023-03-18 | Discharge: 2023-03-18 | Disposition: A | Discharge status: Home / Self Care | Payer: PPO | Source: Ambulatory Visit | Attending: Surgical | Admitting: Surgical

## 2023-03-18 DIAGNOSIS — M25612 Stiffness of left shoulder, not elsewhere classified: Secondary | ICD-10-CM | POA: Diagnosis not present

## 2023-03-18 DIAGNOSIS — M25512 Pain in left shoulder: Secondary | ICD-10-CM

## 2023-03-18 DIAGNOSIS — S46012A Strain of muscle(s) and tendon(s) of the rotator cuff of left shoulder, initial encounter: Secondary | ICD-10-CM | POA: Diagnosis not present

## 2023-03-18 MED ORDER — IOPAMIDOL (ISOVUE-M 200) INJECTION 41%: 15.0000 mL | Freq: Once | INTRAMUSCULAR | Status: DC

## 2023-03-25 ENCOUNTER — Ambulatory Visit (INDEPENDENT_AMBULATORY_CARE_PROVIDER_SITE_OTHER): Payer: PPO | Admitting: Orthopedic Surgery

## 2023-03-25 DIAGNOSIS — M75122 Complete rotator cuff tear or rupture of left shoulder, not specified as traumatic: Secondary | ICD-10-CM

## 2023-03-28 ENCOUNTER — Encounter: Payer: Self-pay | Admitting: Orthopedic Surgery

## 2023-03-28 NOTE — Progress Notes (Signed)
Office Visit Note   Patient: Kevin Lozano           Date of Birth: [birth year]           MRN: [id number] Visit Date: 03/25/2023 Requested by: Johny BlamerHarris, William, MD [phone] [address] [place],  [place] [id number] PCP: Johny BlamerHarris, William, MD  Subjective: Chief Complaint  Patient presents with   Other     Scan review    HPI: Kevin Lozano is a 77 y.o. male who presents to the office reporting left shoulder pain.  Since he was last seen has had an MRI scan.  Pain been going on for 5 months.  He did well with right shoulder rotator cuff tear repair in 2021.  He is a Teacher, English as a foreign languagegolfer.  This affects his sleeping..                ROS: All systems reviewed are negative as they relate to the chief complaint within the history of present illness.  Patient denies fevers or chills.  Assessment & Plan: Visit Diagnoses:  1. Nontraumatic complete tear of left rotator cuff     Plan: Impression is left shoulder rotator cuff tear.  There is a complete tear of the supraspinatus with about 3 cm of retraction.  This is more retraction than he had on the right-hand side.  Biceps tendinitis also present.  Plan at this time after lengthy discussion with the patient is arthroscopy debridement biceps tendon release and tenodesis with rotator cuff tear repair.  Risk benefits are discussed with the patient include not limited to infection or vessel damage incomplete restoration of function as well as possible failure of the repair.  Patient is a smoker which would make tissue quality less robust than others.  This tear is also more retracted than the 1 on his right shoulder.  That also increases the chance for failure.  Could consider cuff mend depending on tissue quality.  Patient understands risk and benefits.  Plans to use CPM after surgery.  All questions answered  Follow-Up Instructions: No follow-ups on file.   Orders:  No orders of the defined types were placed in this encounter.  No orders of the defined  types were placed in this encounter.     Procedures: No procedures performed   Clinical Data: No additional findings.  Objective: Vital Signs: There were no vitals taken for this visit.  Physical Exam:  Constitutional: Patient appears well-developed HEENT:  Head: Normocephalic Eyes:EOM are normal Neck: Normal range of motion Cardiovascular: Normal rate Pulmonary/chest: Effort normal Neurologic: Patient is alert Skin: Skin is warm Psychiatric: Patient has normal mood and affect  Ortho Exam: Ortho exam demonstrates good cervical spine range of motion.  On the left-hand side patient does have a little coarseness with internal/external rotation at 90 degrees of abduction.  Passive range of motion is 60/95/170.  Has good subscap strength but slightly weaker external rotation on the left compared to the right.  No AC joint tenderness is present.  Specialty Comments:  No specialty comments available.  Imaging: No results found.   PMFS History: There are no problems to display for this patient.  Past Medical History:  Diagnosis Date   Arthritis    COPD (chronic obstructive pulmonary disease)    Coronary artery calcification     Family History  Problem Relation Age of Onset   Hyperlipidemia Mother    Hyperlipidemia Father     Past Surgical History:  Procedure Laterality Date  HERNIA REPAIR     INGUINAL HERNIA REPAIR Left 08/01/2021   Procedure: OPEN LEFT INGUINAL HERNIA REPAIR WITH MESH;  Surgeon: Abigail Miyamoto, MD;  Location: [facility];  Service: General;  Laterality: Left;   ROTATOR CUFF REPAIR Right    TOTAL KNEE ARTHROPLASTY Bilateral    WISDOM TOOTH EXTRACTION     Social History   Occupational History   Not on file  Tobacco Use   Smoking status: Every Day    Packs/day: .5    Types: Cigarettes   Smokeless tobacco: Never   Tobacco comments:    half pack daily-03/20/21-AH  Vaping Use   Vaping Use: Never used  Substance and Sexual Activity   Alcohol  use: Never   Drug use: Never   Sexual activity: Not on file

## 2023-03-30 NOTE — Pre-Procedure Instructions (Addendum)
Surgical Instructions    Your procedure is scheduled on Monday, April 06, 2023  Report to [facility] Main Entrance "A" at 5:30 A.M., then check in with the Admitting office.  Call this number if you have problems the morning of surgery:  [phone]  If you have any questions prior to your surgery date call [phone]: Open Monday-Friday 8am-4pm If you experience any cold or flu symptoms such as cough, fever, chills, shortness of breath, etc. between now and your scheduled surgery, please notify us at the above number.       Remember:   Do not eat after midnight the night before your surgery                         You may drink clear liquids until 4:15 AM the morning of your surgery.   Clear liquids allowed are: Water, Non-Citrus Juices (without pulp), Carbonated Beverages, Clear Tea, Black Coffee Only (NO MILK, CREAM OR POWDERED CREAMER of any kind), and Gatorade.  Patient Instructions  The night before surgery:  No food after midnight. ONLY clear liquids after midnight  The day of surgery  Drink ONE (1) Pre-Surgery Clear Ensure by 4:15 AM the morning of surgery. Drink in one sitting. Do not sip.  This drink was given to you during your hospital  pre-op appointment visit.  Nothing else to drink after completing the Pre-Surgery Clear Ensure.                  Take these medicines the morning of surgery with A SIP OF WATER:               acetaminophen (TYLENOL) as needed              atorvastatin (LIPITOR)    As of today, STOP taking  Aleve, Naproxen, Ibuprofen, Motrin, Advil, Goody's, BC's, all herbal medications, fish oil, and all vitamins.  Follow your surgeon's instructions on when to stop Aspirin.  If no instructions were given by your surgeon then you will need to call the office to get those instructions.    Do NOT Smoke (Tobacco/Vaping) for 24 hours prior to your procedure.  If you use a CPAP at night, you may bring your mask/headgear for your overnight stay.    Contacts, glasses, piercing's, hearing aid's, dentures or partials may not be worn into surgery, please bring cases for these belongings.    For patients admitted to the hospital, discharge time will be determined by your treatment team.   Patients discharged the day of surgery will not be allowed to drive home, and someone needs to stay with them for 24 hours.  SURGICAL WAITING ROOM VISITATION Patients having surgery or a procedure may have no more than 2 support people in the waiting area - these visitors may rotate.   Children under the age of 46 must have an adult with them who is not the patient. If the patient needs to stay at the hospital during part of their recovery, the visitor guidelines for inpatient rooms apply. Pre-op nurse will coordinate an appropriate time for 1 support person to accompany patient in pre-op.  This support person may not rotate.   Please refer to the [facility] website for the visitor guidelines for Inpatients (after your surgery is over and you are in a regular room).    Special instructions:   - Preparing For Surgery  Before surgery, you can play an important role. Because skin  is not sterile, your skin needs to be as free of germs as possible. You can reduce the number of germs on your skin by washing with CHG (chlorahexidine gluconate) Soap before surgery.  CHG is an antiseptic cleaner which kills germs and bonds with the skin to continue killing germs even after washing.    Oral Hygiene is also important to reduce your risk of infection.  Remember - BRUSH YOUR TEETH THE MORNING OF SURGERY WITH YOUR REGULAR TOOTHPASTE  Please do not use if you have an allergy to CHG or antibacterial soaps. If your skin becomes reddened/irritated stop using the CHG.  Do not shave (including legs and underarms) for at least 48 hours prior to first CHG shower. It is OK to shave your face.  Please follow these instructions carefully.   Shower the NIGHT BEFORE  SURGERY and the MORNING OF SURGERY  If you chose to wash your hair, wash your hair first as usual with your normal shampoo.  After you shampoo, rinse your hair and body thoroughly to remove the shampoo.  Use CHG Soap as you would any other liquid soap. You can apply CHG directly to the skin and wash gently with a scrungie or a clean washcloth.   Apply the CHG Soap to your body ONLY FROM THE NECK DOWN.  Do not use on open wounds or open sores. Avoid contact with your eyes, ears, mouth and genitals (private parts). Wash Face and genitals (private parts)  with your normal soap.   Wash thoroughly, paying special attention to the area where your surgery will be performed.  Thoroughly rinse your body with warm water from the neck down.  DO NOT shower/wash with your normal soap after using and rinsing off the CHG Soap.  Pat yourself dry with a CLEAN TOWEL.  Wear CLEAN PAJAMAS to bed the night before surgery  Place CLEAN SHEETS on your bed the night before your surgery  DO NOT SLEEP WITH PETS.   Day of Surgery: Take a shower with CHG soap. Do not wear jewelry or makeup Do not wear lotions, powders, perfumes/colognes, or deodorant. Do not shave 48 hours prior to surgery.  Men may shave face and neck. Do not bring valuables to the hospital.  [facility] is not responsible for any belongings or valuables. Do not wear nail polish, gel polish, artificial nails, or any other type of covering on natural nails (fingers and toes) If you have artificial nails or gel coating that need to be removed by a nail salon, please have this removed prior to surgery. Artificial nails or gel coating may interfere with anesthesia's ability to adequately monitor your vital signs. Wear Clean/Comfortable clothing the morning of surgery Remember to brush your teeth WITH YOUR REGULAR TOOTHPASTE.   Please read over the following fact sheets that you were given.    If you received a COVID test during your pre-op  visit  it is requested that you wear a mask when out in public, stay away from anyone that may not be feeling well and notify your surgeon if you develop symptoms. If you have been in contact with anyone that has tested positive in the last 10 days please notify you surgeon.

## 2023-03-31 ENCOUNTER — Other Ambulatory Visit: Payer: Self-pay

## 2023-03-31 ENCOUNTER — Encounter (HOSPITAL_COMMUNITY): Payer: Self-pay

## 2023-03-31 ENCOUNTER — Encounter (HOSPITAL_COMMUNITY)
Admission: RE | Admit: 2023-03-31 | Discharge: 2023-03-31 | Disposition: A | Discharge status: Home / Self Care | Payer: PPO | Source: Ambulatory Visit | Attending: Orthopedic Surgery | Admitting: Orthopedic Surgery

## 2023-03-31 DIAGNOSIS — Z01812 Encounter for preprocedural laboratory examination: Secondary | ICD-10-CM | POA: Diagnosis not present

## 2023-03-31 NOTE — Progress Notes (Signed)
PCP - Dr. Johny Blamer  Cardiologist - Dr. Lance Muss  PPM/ICD - denies  Chest x-ray - 06/10/10 EKG - 08/29/22 Stress Test - "many years ago" per pt, normal ECHO - denies Cardiac Cath - denies  Sleep Study - denies   DM- denies  Blood Thinner Instructions: n/a Aspirin Instructions: Hold 5 days. Last dose 4/10  ERAS Protcol - yes PRE-SURGERY Ensure given at PAT  COVID TEST- n/a   Anesthesia review: yes, cardiac hx  Patient denies shortness of breath, fever, cough and chest pain at PAT appointment   All instructions explained to the patient, with a verbal understanding of the material. Patient agrees to go over the instructions while at home for a better understanding. The opportunity to ask questions was provided.

## 2023-04-01 ENCOUNTER — Ambulatory Visit
Admission: RE | Admit: 2023-04-01 | Discharge: 2023-04-01 | Disposition: A | Discharge status: Home / Self Care | Payer: PPO | Source: Ambulatory Visit | Attending: Family Medicine | Admitting: Family Medicine

## 2023-04-01 DIAGNOSIS — Z122 Encounter for screening for malignant neoplasm of respiratory organs: Secondary | ICD-10-CM

## 2023-04-01 DIAGNOSIS — Z87891 Personal history of nicotine dependence: Secondary | ICD-10-CM

## 2023-04-01 DIAGNOSIS — F17210 Nicotine dependence, cigarettes, uncomplicated: Secondary | ICD-10-CM

## 2023-04-01 NOTE — Progress Notes (Signed)
Anesthesia Chart Review:   Case: [id number] Date/Time: 04/06/23 0700   Procedure: LEFT SHOULDER ARTHROSCOPY, DEBRIDEMENT, MINI OPEN BICEPS TENODESIS AND ROTATOR CUFF TEAR REPAIR (Left)   Anesthesia type: General   Pre-op diagnosis: left shoulder rotator cuff tear, biceps tendinitis   [facility] ROOM 09 / [facility]   Surgeons: Cammy Copa, MD       DISCUSSION: Pt is 77 years old with hx coronary artery calcification on CT, COPD.   Significant smoking history. Quit smoking 2 days ago on 03/30/23.   PROVIDERS: - PCP is Johny Blamer, MD - Cardiologist is Lance Muss, MD. Last office visit 08/29/22 with Robin Searing, NP  LABS:  CBC w/diff and CMP at PCP's office 03/13/23 are normal. Results can be seen in care everywhere in progress note dated 03/13/23  IMAGES: CT chest lung ca screen 04/01/23: ***  EKG 08/29/22: SR. Possible left atrial enlargement    CV: N/A  Past Medical History:  Diagnosis Date   Arthritis    COPD (chronic obstructive pulmonary disease)    Coronary artery calcification     Past Surgical History:  Procedure Laterality Date   CATARACT EXTRACTION Bilateral    HERNIA REPAIR Right 1971   inguinal   INGUINAL HERNIA REPAIR Left 08/01/2021   Procedure: OPEN LEFT INGUINAL HERNIA REPAIR WITH MESH;  Surgeon: Abigail Miyamoto, MD;  Location: [facility];  Service: General;  Laterality: Left;   ROTATOR CUFF REPAIR Right    TONSILLECTOMY     removed as a child   TOTAL KNEE ARTHROPLASTY Bilateral    WISDOM TOOTH EXTRACTION      MEDICATIONS: No current facility-administered medications for this encounter.    acetaminophen (TYLENOL) 650 MG CR tablet   aspirin EC 81 MG tablet   atorvastatin (LIPITOR) 20 MG tablet   Tolnaftate (ABSORBINE JR EX)   triamcinolone cream (KENALOG) 0.1 %

## 2023-04-02 ENCOUNTER — Encounter: Payer: Self-pay | Admitting: Emergency Medicine

## 2023-04-02 ENCOUNTER — Other Ambulatory Visit: Payer: Self-pay | Admitting: Acute Care

## 2023-04-02 DIAGNOSIS — Z122 Encounter for screening for malignant neoplasm of respiratory organs: Secondary | ICD-10-CM

## 2023-04-02 DIAGNOSIS — F17210 Nicotine dependence, cigarettes, uncomplicated: Secondary | ICD-10-CM

## 2023-04-02 DIAGNOSIS — Z87891 Personal history of nicotine dependence: Secondary | ICD-10-CM

## 2023-04-02 NOTE — Anesthesia Preprocedure Evaluation (Deleted)
Anesthesia Evaluation    Airway        Dental   Pulmonary Patient abstained from smoking., former smoker          Cardiovascular      Neuro/Psych    GI/Hepatic   Endo/Other    Renal/GU      Musculoskeletal   Abdominal   Peds  Hematology   Anesthesia Other Findings   Reproductive/Obstetrics                             Anesthesia Physical Anesthesia Plan  ASA:   Anesthesia Plan:    Post-op Pain Management:    Induction:   PONV Risk Score and Plan:   Airway Management Planned:   Additional Equipment:   Intra-op Plan:   Post-operative Plan:   Informed Consent:   Plan Discussed with:   Anesthesia Plan Comments: (See APP note by Joslyn Hy, FNP )       Anesthesia Quick Evaluation

## 2023-04-05 NOTE — Anesthesia Preprocedure Evaluation (Signed)
Anesthesia Evaluation  Patient identified by MRN, date of birth, ID band Patient awake    Reviewed: Allergy & Precautions, NPO status , Patient's Chart, lab work & pertinent test results  History of Anesthesia Complications Negative for: history of anesthetic complications  Airway Mallampati: II  TM Distance: >3 FB Neck ROM: Full    Dental  (+) Dental Advisory Given, Upper Dentures, Implants   Pulmonary COPD, Current Smoker and Patient abstained from smoking.   Pulmonary exam normal        Cardiovascular (-) angina + CAD  Normal cardiovascular exam     Neuro/Psych negative neurological ROS  negative psych ROS   GI/Hepatic negative GI ROS, Neg liver ROS,,,  Endo/Other  negative endocrine ROS    Renal/GU negative Renal ROS     Musculoskeletal  (+) Arthritis ,    Abdominal   Peds  Hematology negative hematology ROS (+)   Anesthesia Other Findings   Reproductive/Obstetrics                             Anesthesia Physical Anesthesia Plan  ASA: 3  Anesthesia Plan: General   Post-op Pain Management: Tylenol PO (pre-op)* and Regional block*   Induction: Intravenous  PONV Risk Score and Plan: 1 and Treatment may vary due to age or medical condition, Ondansetron and Dexamethasone  Airway Management Planned: Oral ETT  Additional Equipment: None  Intra-op Plan:   Post-operative Plan: Extubation in OR  Informed Consent: I have reviewed the patients History and Physical, chart, labs and discussed the procedure including the risks, benefits and alternatives for the proposed anesthesia with the patient or authorized representative who has indicated his/her understanding and acceptance.     Dental advisory given  Plan Discussed with: CRNA and Anesthesiologist  Anesthesia Plan Comments:        Anesthesia Quick Evaluation

## 2023-04-06 ENCOUNTER — Encounter (HOSPITAL_COMMUNITY)
Admission: RE | Disposition: A | Discharge status: Home / Self Care | Payer: Self-pay | Source: Home / Self Care | Attending: Orthopedic Surgery

## 2023-04-06 ENCOUNTER — Ambulatory Visit (HOSPITAL_COMMUNITY): Payer: PPO | Admitting: Emergency Medicine

## 2023-04-06 ENCOUNTER — Other Ambulatory Visit: Payer: Self-pay

## 2023-04-06 ENCOUNTER — Ambulatory Visit (HOSPITAL_COMMUNITY)
Admission: RE | Admit: 2023-04-06 | Discharge: 2023-04-06 | Disposition: A | Discharge status: Home / Self Care | Payer: PPO | Attending: Orthopedic Surgery | Admitting: Orthopedic Surgery

## 2023-04-06 ENCOUNTER — Encounter (HOSPITAL_COMMUNITY): Payer: Self-pay | Admitting: Orthopedic Surgery

## 2023-04-06 ENCOUNTER — Ambulatory Visit (HOSPITAL_BASED_OUTPATIENT_CLINIC_OR_DEPARTMENT_OTHER): Payer: PPO | Admitting: Emergency Medicine

## 2023-04-06 DIAGNOSIS — M7522 Bicipital tendinitis, left shoulder: Secondary | ICD-10-CM | POA: Diagnosis not present

## 2023-04-06 DIAGNOSIS — M19012 Primary osteoarthritis, left shoulder: Secondary | ICD-10-CM

## 2023-04-06 DIAGNOSIS — S43432A Superior glenoid labrum lesion of left shoulder, initial encounter: Secondary | ICD-10-CM

## 2023-04-06 DIAGNOSIS — F17210 Nicotine dependence, cigarettes, uncomplicated: Secondary | ICD-10-CM

## 2023-04-06 DIAGNOSIS — M75102 Unspecified rotator cuff tear or rupture of left shoulder, not specified as traumatic: Secondary | ICD-10-CM

## 2023-04-06 DIAGNOSIS — X58XXXA Exposure to other specified factors, initial encounter: Secondary | ICD-10-CM | POA: Diagnosis not present

## 2023-04-06 DIAGNOSIS — J449 Chronic obstructive pulmonary disease, unspecified: Secondary | ICD-10-CM | POA: Diagnosis not present

## 2023-04-06 DIAGNOSIS — I2510 Atherosclerotic heart disease of native coronary artery without angina pectoris: Secondary | ICD-10-CM | POA: Diagnosis not present

## 2023-04-06 DIAGNOSIS — S46012A Strain of muscle(s) and tendon(s) of the rotator cuff of left shoulder, initial encounter: Secondary | ICD-10-CM | POA: Diagnosis not present

## 2023-04-06 DIAGNOSIS — M659 Synovitis and tenosynovitis, unspecified: Secondary | ICD-10-CM | POA: Diagnosis not present

## 2023-04-06 DIAGNOSIS — F17200 Nicotine dependence, unspecified, uncomplicated: Secondary | ICD-10-CM | POA: Diagnosis not present

## 2023-04-06 DIAGNOSIS — M65812 Other synovitis and tenosynovitis, left shoulder: Secondary | ICD-10-CM

## 2023-04-06 DIAGNOSIS — G8918 Other acute postprocedural pain: Secondary | ICD-10-CM | POA: Diagnosis not present

## 2023-04-06 DIAGNOSIS — Z01818 Encounter for other preprocedural examination: Secondary | ICD-10-CM

## 2023-04-06 HISTORY — PX: SHOULDER ARTHROSCOPY WITH SUBACROMIAL DECOMPRESSION, ROTATOR CUFF REPAIR AND BICEP TENDON REPAIR: SHX5687

## 2023-04-06 SURGERY — SHOULDER ARTHROSCOPY WITH SUBACROMIAL DECOMPRESSION, ROTATOR CUFF REPAIR AND BICEP TENDON REPAIR
Anesthesia: General | Site: Shoulder | Laterality: Left

## 2023-04-06 MED ORDER — ORAL CARE MOUTH RINSE: 15.0000 mL | Freq: Once | OROMUCOSAL | Status: AC

## 2023-04-06 MED ORDER — VANCOMYCIN HCL 1000 MG IV SOLR
INTRAVENOUS | Status: DC | PRN
  Administered 2023-04-06: 1000 mg

## 2023-04-06 MED ORDER — SUGAMMADEX SODIUM 200 MG/2ML IV SOLN
INTRAVENOUS | Status: DC | PRN
  Administered 2023-04-06: 200 mg via INTRAVENOUS

## 2023-04-06 MED ORDER — DEXAMETHASONE SODIUM PHOSPHATE 4 MG/ML IJ SOLN
INTRAMUSCULAR | Status: DC | PRN
  Administered 2023-04-06: 4 mg via INTRAVENOUS

## 2023-04-06 MED ORDER — LIDOCAINE 2% (20 MG/ML) 5 ML SYRINGE
INTRAMUSCULAR | Status: DC | PRN
  Administered 2023-04-06: 100 mg via INTRAVENOUS

## 2023-04-06 MED ORDER — FENTANYL CITRATE (PF) 100 MCG/2ML IJ SOLN: 25.0000 ug | INTRAMUSCULAR | Status: DC | PRN

## 2023-04-06 MED ORDER — ROCURONIUM BROMIDE 10 MG/ML (PF) SYRINGE
PREFILLED_SYRINGE | INTRAVENOUS | Status: DC | PRN
  Administered 2023-04-06: 50 mg via INTRAVENOUS
  Administered 2023-04-06: 20 mg via INTRAVENOUS
  Administered 2023-04-06: 10 mg via INTRAVENOUS

## 2023-04-06 MED ORDER — ONDANSETRON HCL 4 MG/2ML IJ SOLN
INTRAMUSCULAR | Status: AC
  Filled 2023-04-06: qty 2

## 2023-04-06 MED ORDER — FENTANYL CITRATE (PF) 100 MCG/2ML IJ SOLN
INTRAMUSCULAR | Status: DC | PRN
  Administered 2023-04-06: 50 ug via INTRAVENOUS

## 2023-04-06 MED ORDER — BUPIVACAINE LIPOSOME 1.3 % IJ SUSP
INTRAMUSCULAR | Status: DC | PRN
  Administered 2023-04-06: 10 mL via PERINEURAL

## 2023-04-06 MED ORDER — LACTATED RINGERS IV SOLN: INTRAVENOUS | Status: DC

## 2023-04-06 MED ORDER — EPINEPHRINE PF 1 MG/ML IJ SOLN
INTRAMUSCULAR | Status: AC
  Filled 2023-04-06: qty 3

## 2023-04-06 MED ORDER — EPINEPHRINE PF 1 MG/ML IJ SOLN
INTRAMUSCULAR | Status: DC | PRN
  Administered 2023-04-06: 1 mg

## 2023-04-06 MED ORDER — EPHEDRINE SULFATE-NACL 50-0.9 MG/10ML-% IV SOSY
PREFILLED_SYRINGE | INTRAVENOUS | Status: DC | PRN
  Administered 2023-04-06: 10 mg via INTRAVENOUS
  Administered 2023-04-06: 5 mg via INTRAVENOUS

## 2023-04-06 MED ORDER — FENTANYL CITRATE (PF) 250 MCG/5ML IJ SOLN
INTRAMUSCULAR | Status: AC
  Filled 2023-04-06: qty 5

## 2023-04-06 MED ORDER — PHENYLEPHRINE HCL-NACL 20-0.9 MG/250ML-% IV SOLN
INTRAVENOUS | Status: DC | PRN
  Administered 2023-04-06: 25 ug/min via INTRAVENOUS

## 2023-04-06 MED ORDER — BUPIVACAINE HCL (PF) 0.5 % IJ SOLN
INTRAMUSCULAR | Status: DC | PRN
  Administered 2023-04-06: 15 mL via PERINEURAL

## 2023-04-06 MED ORDER — CEFAZOLIN SODIUM-DEXTROSE 2-4 GM/100ML-% IV SOLN
2.0000 g | INTRAVENOUS | Status: AC
  Administered 2023-04-06: 2 g via INTRAVENOUS
  Filled 2023-04-06: qty 100

## 2023-04-06 MED ORDER — HYDROCODONE-ACETAMINOPHEN 5-325 MG PO TABS: 1.0000 | ORAL_TABLET | ORAL | 0 refills | Status: AC | PRN

## 2023-04-06 MED ORDER — CHLORHEXIDINE GLUCONATE 0.12 % MT SOLN
15.0000 mL | Freq: Once | OROMUCOSAL | Status: AC
  Administered 2023-04-06: 15 mL via OROMUCOSAL
  Filled 2023-04-06: qty 15

## 2023-04-06 MED ORDER — ROCURONIUM BROMIDE 10 MG/ML (PF) SYRINGE
PREFILLED_SYRINGE | INTRAVENOUS | Status: AC
  Filled 2023-04-06: qty 10

## 2023-04-06 MED ORDER — PROPOFOL 10 MG/ML IV BOLUS
INTRAVENOUS | Status: AC
  Filled 2023-04-06: qty 20

## 2023-04-06 MED ORDER — IBUPROFEN 800 MG PO TABS: 800.0000 mg | ORAL_TABLET | Freq: Three times a day (TID) | ORAL | 0 refills | Status: DC | PRN

## 2023-04-06 MED ORDER — DEXAMETHASONE SODIUM PHOSPHATE 10 MG/ML IJ SOLN
INTRAMUSCULAR | Status: AC
  Filled 2023-04-06: qty 1

## 2023-04-06 MED ORDER — ONDANSETRON HCL 4 MG/2ML IJ SOLN
INTRAMUSCULAR | Status: DC | PRN
  Administered 2023-04-06: 4 mg via INTRAVENOUS

## 2023-04-06 MED ORDER — LIDOCAINE 2% (20 MG/ML) 5 ML SYRINGE
INTRAMUSCULAR | Status: AC
  Filled 2023-04-06: qty 5

## 2023-04-06 MED ORDER — METHOCARBAMOL 500 MG PO TABS: 500.0000 mg | ORAL_TABLET | Freq: Three times a day (TID) | ORAL | 1 refills | Status: DC | PRN

## 2023-04-06 MED ORDER — PROPOFOL 10 MG/ML IV BOLUS
INTRAVENOUS | Status: DC | PRN
  Administered 2023-04-06: 130 mg via INTRAVENOUS

## 2023-04-06 MED ORDER — SODIUM CHLORIDE 0.9 % IR SOLN
Status: DC | PRN
  Administered 2023-04-06 (×2): 3000 mL

## 2023-04-06 MED ORDER — VANCOMYCIN HCL 1000 MG IV SOLR
INTRAVENOUS | Status: AC
  Filled 2023-04-06: qty 20

## 2023-04-06 MED ORDER — TRANEXAMIC ACID-NACL 1000-0.7 MG/100ML-% IV SOLN
1000.0000 mg | INTRAVENOUS | Status: AC
  Administered 2023-04-06: 1000 mg via INTRAVENOUS
  Filled 2023-04-06: qty 100

## 2023-04-06 MED ORDER — OXYCODONE HCL 5 MG/5ML PO SOLN: 5.0000 mg | Freq: Once | ORAL | Status: DC | PRN

## 2023-04-06 MED ORDER — ACETAMINOPHEN 500 MG PO TABS
1000.0000 mg | ORAL_TABLET | Freq: Once | ORAL | Status: AC
Start: 2023-04-06 — End: 2023-04-06
  Administered 2023-04-06: 1000 mg via ORAL
  Filled 2023-04-06: qty 2

## 2023-04-06 MED ORDER — BUPIVACAINE HCL (PF) 0.25 % IJ SOLN
INTRAMUSCULAR | Status: AC
  Filled 2023-04-06: qty 30

## 2023-04-06 MED ORDER — OXYCODONE HCL 5 MG PO TABS: 5.0000 mg | ORAL_TABLET | Freq: Once | ORAL | Status: DC | PRN

## 2023-04-06 MED ORDER — ONDANSETRON HCL 4 MG/2ML IJ SOLN: 4.0000 mg | Freq: Once | INTRAMUSCULAR | Status: DC | PRN

## 2023-04-06 SURGICAL SUPPLY — 71 items
AID PSTN UNV HD RSTRNT DISP (MISCELLANEOUS) ×1
ANCH SUT 2 SWLK 19.1 CLS EYLT (Anchor) ×3 IMPLANT
ANCH SUT FBRTK 1.3 2 TPE (Anchor) ×3 IMPLANT
ANCHOR FBRTK 2.6 SUTURETAP 1.3 (Anchor) IMPLANT
ANCHOR SWIVELOCK BIO 4.75X19.1 (Anchor) IMPLANT
BLADE EXCALIBUR 4.0X13 (MISCELLANEOUS) IMPLANT
BLADE SHAVER TORPEDO 4X13 (MISCELLANEOUS) IMPLANT
BURR OVAL 8 FLU 4.0X13 (MISCELLANEOUS) IMPLANT
COVER SURGICAL LIGHT HANDLE (MISCELLANEOUS) ×1 IMPLANT
DRAPE INCISE IOBAN 66X45 STRL (DRAPES) ×2 IMPLANT
DRAPE STERI 35X30 U-POUCH (DRAPES) ×1 IMPLANT
DRAPE U-SHAPE 47X51 STRL (DRAPES) ×2 IMPLANT
DRSG TEGADERM 4X4.75 (GAUZE/BANDAGES/DRESSINGS) ×3 IMPLANT
DURAPREP 26ML APPLICATOR (WOUND CARE) ×1 IMPLANT
DW OUTFLOW CASSETTE/TUBE SET (MISCELLANEOUS) ×1 IMPLANT
ELECT REM PT RETURN 9FT ADLT (ELECTROSURGICAL) ×1
ELECTRODE REM PT RTRN 9FT ADLT (ELECTROSURGICAL) ×1 IMPLANT
FIBERLOOP 2 0 (SUTURE) IMPLANT
GAUZE SPONGE 4X4 12PLY STRL (GAUZE/BANDAGES/DRESSINGS) ×1 IMPLANT
GAUZE SPONGE 4X4 12PLY STRL LF (GAUZE/BANDAGES/DRESSINGS) ×1 IMPLANT
GAUZE XEROFORM 1X8 LF (GAUZE/BANDAGES/DRESSINGS) IMPLANT
GLOVE BIOGEL PI IND STRL 7.0 (GLOVE) ×1 IMPLANT
GLOVE BIOGEL PI IND STRL 8 (GLOVE) ×1 IMPLANT
GLOVE ECLIPSE 7.0 STRL STRAW (GLOVE) ×1 IMPLANT
GLOVE ECLIPSE 8.0 STRL XLNG CF (GLOVE) ×1 IMPLANT
GOWN STRL REUS W/ TWL LRG LVL3 (GOWN DISPOSABLE) ×3 IMPLANT
GOWN STRL REUS W/TWL LRG LVL3 (GOWN DISPOSABLE) ×3
KIT BASIN OR (CUSTOM PROCEDURE TRAY) ×1 IMPLANT
KIT TURNOVER KIT B (KITS) ×1 IMPLANT
MANIFOLD NEPTUNE II (INSTRUMENTS) ×1 IMPLANT
NDL HD SCORPION MEGA LOADER (NEEDLE) IMPLANT
NDL SCORPION MULTI FIRE (NEEDLE) IMPLANT
NDL SPNL 18GX3.5 QUINCKE PK (NEEDLE) ×1 IMPLANT
NDL SUT 6 .5 CRC .975X.05 MAYO (NEEDLE) IMPLANT
NDL TAPERED W/ NITINOL LOOP (MISCELLANEOUS) IMPLANT
NEEDLE MAYO TAPER (NEEDLE)
NEEDLE SCORPION MULTI FIRE (NEEDLE) IMPLANT
NEEDLE SPNL 18GX3.5 QUINCKE PK (NEEDLE) ×1 IMPLANT
NEEDLE TAPERED W/ NITINOL LOOP (MISCELLANEOUS) ×1 IMPLANT
NS IRRIG 1000ML POUR BTL (IV SOLUTION) ×1 IMPLANT
PACK SHOULDER (CUSTOM PROCEDURE TRAY) ×1 IMPLANT
PAD ARMBOARD 7.5X6 YLW CONV (MISCELLANEOUS) ×2 IMPLANT
PROBE APOLLO 90XL (SURGICAL WAND) ×1 IMPLANT
RESTRAINT HEAD UNIVERSAL NS (MISCELLANEOUS) ×1 IMPLANT
SLING ARM FOAM STRAP LRG (SOFTGOODS) IMPLANT
SLING ARM IMMOBILIZER LRG (SOFTGOODS) IMPLANT
SPONGE T-LAP 4X18 ~~LOC~~+RFID (SPONGE) ×2 IMPLANT
STRIP CLOSURE SKIN 1/2X4 (GAUZE/BANDAGES/DRESSINGS) ×1 IMPLANT
SUCTION FRAZIER HANDLE 10FR (MISCELLANEOUS) ×1
SUCTION TUBE FRAZIER 10FR DISP (MISCELLANEOUS) ×1 IMPLANT
SUT ETHILON 3 0 FSL (SUTURE) IMPLANT
SUT ETHILON 3 0 PS 1 (SUTURE) ×1 IMPLANT
SUT FIBERWIRE #2 38 T-5 BLUE (SUTURE) ×1
SUT FIBERWIRE 2-0 18 17.9 3/8 (SUTURE) ×1
SUT MNCRL AB 3-0 PS2 18 (SUTURE) ×1 IMPLANT
SUT VIC AB 0 CT1 27 (SUTURE) ×2
SUT VIC AB 0 CT1 27XBRD ANBCTR (SUTURE) ×1 IMPLANT
SUT VIC AB 1 CT1 27 (SUTURE) ×1
SUT VIC AB 1 CT1 27XBRD ANBCTR (SUTURE) IMPLANT
SUT VIC AB 2-0 CT1 27 (SUTURE) ×1
SUT VIC AB 2-0 CT1 TAPERPNT 27 (SUTURE) ×1 IMPLANT
SUT VICRYL 0 UR6 27IN ABS (SUTURE) ×1 IMPLANT
SUT VICRYL 1 TIES 12X18 (SUTURE) ×1 IMPLANT
SUTURE FIBERWR #2 38 T-5 BLUE (SUTURE) IMPLANT
SUTURE FIBERWR 2-0 18 17.9 3/8 (SUTURE) IMPLANT
SUTURE TAPE 1.3 FIBERLOP 20 ST (SUTURE) IMPLANT
SUTURETAPE 1.3 FIBERLOOP 20 ST (SUTURE)
TOWEL GREEN STERILE (TOWEL DISPOSABLE) ×1 IMPLANT
TOWEL GREEN STERILE FF (TOWEL DISPOSABLE) ×1 IMPLANT
TUBING ARTHROSCOPY IRRIG 16FT (MISCELLANEOUS) ×1 IMPLANT
WATER STERILE IRR 1000ML POUR (IV SOLUTION) ×1 IMPLANT

## 2023-04-06 NOTE — Op Note (Signed)
NAMEILIA, ENGELBERT MEDICAL RECORD NO: [id number] ACCOUNT NO: [id number] DATE OF BIRTH: 17-May-1946 FACILITY: [facility] LOCATION: [facility] PHYSICIAN: Graylin Shiver. August Saucer, MD  Operative Report   DATE OF PROCEDURE: 04/06/2023  PREOPERATIVE DIAGNOSIS:  Left shoulder rotator cuff tear, biceps tendinitis and shoulder synovitis.  POSTOPERATIVE DIAGNOSIS:  Left shoulder rotator cuff tear involving the supraspinatus and infraspinatus with retraction and biceps tendinitis with retraction of the biceps tendon to the transverse humeral ligament, but not below and shoulder synovitis  with early grade 3 humeral head chondromalacia over a dime-sized area on the humeral head.  PROCEDURE:  Left shoulder arthroscopy with limited debridement of the superior labrum and shoulder synovitis using the Arthrocare wand with biceps tenodesis and rotator cuff tear repair using 3 x 3 construct.  SURGEON:  Graylin Shiver. August Saucer, MD  ASSISTANT:  Karenann Cai.  INDICATIONS:  This is an active 77 year old patient with left shoulder pain with MRI scan evidence of rotator cuff tear and biceps tendinitis, who presents for operative management after explanation of risks and benefits.  DESCRIPTION OF PROCEDURE:  The patient was brought to the operating room where general anesthetic was induced.  The patient was placed in the beach chair position with head in neutral position.  Left arm, shoulder and hand prescrubbed with alcohol and  Betadine, allowed to air dry, prepped with DuraPrep solution and draped in sterile manner.  Ioban used to cover the axilla and seal the operative field.  The patient's head was in neutral position in the beach chair.  Sterile draping then performed.   Timeout was called.  Posterior portal was then created 2 cm medial and inferior to the posterolateral margin of the acromion.  Diagnostic arthroscopy was performed.  Anterior portal created under direct visualization.  The patient's biceps tendon had  ruptured and  was present in the intertubercular groove.  Significant synovitis was present in the rotator interval, which was debrided.  There was a large rotator cuff tear measuring about 3 x 4 cm.  There was also a nickel-sized area of grade 3  chondromalacia on the humeral head with no loose chondral flaps.  After creating anterior portal the superior labrum was debrided and synovitis was also debrided using a shaver.  Anterior, inferior and posterior inferior glenohumeral ligaments were  intact.  At this time, the tear was deemed repairable and the instruments were removed, and the portals were closed using 3-0 nylon.  Ioban then used to cover the entire operative field.  Incision made off the anterolateral margin of the acromion.  Skin  and subcutaneous tissue were sharply divided.  Deltoid was split and measured distance of 4 cm from the anterolateral margin of the acromion marked with a #1 Vicryl suture.  Bursectomy performed. The biceps tendon was then identified in the  intertubercular groove and it was relatively immobile.  Oversewn x3 with 0 Vicryl suture.  Rotator cuff tear was then identified.  8 Mason-Allen traction sutures were placed in the leading edge beginning with the infraspinatus and wrapping around to the  supraspinatus.  Unhealthy appearing rotator cuff tissue was debrided prior to placing those sutures.  Footprint was then prepared.  Releases were performed of the coracohumeral ligament.  This gave good mobilization of the tendon to essentially cover the  entire footprint.  There was a small cleft anteriorly.  That was around the supraspinatus and subscapularis.  Next, with those sutures placed we were able to mobilize the tendon over.  Three knotless Arthrex suture tape anchors were  placed at the  interface between the humeral head and the greater tuberosity.  The suture tapes were then passed with a Scorpion from posterior to anterior, equidistant in order to secure a good repair.  Next, the  Vicryl sutures were placed into a SwiveLock with the  rotator cuff reduced.  This gave a very nice reduction of the rotator cuff tissue to the humerus.  The suture tapes were then tied and crossed x6 knots.  The suture tapes were then placed into SwiveLocks anterior and posterior to the initial SwiveLock  for the Vicryl sutures.  This gave a watertight repair.  SwiveLock was placed with the arm in adduction.  The knotless mechanisms for the anterior 2 SwiveLocks were used to reinforce the anterior repair.  Acromioplasty performed.  Thorough irrigation  then performed.  Arm taken through range of motion, which was excellent with about 60 degrees of external rotation, 100 of abduction and 170 forward flexion.  Thorough irrigation was performed.  Vancomycin powder placed.  Deltoid split closed using #1  Vicryl suture followed by interrupted inverted 0 Vicryl suture, 2-0 Vicryl suture, and 3-0 Monocryl with Steri-Strips and impervious dressings and shoulder sling applied.  Luke's assistance was required at all times for retraction, opening, closing,  mobilization of tissue.  His assistance was a medical necessity.   PUS D: 04/06/2023 10:27:51 am T: 04/06/2023 11:39:00 am  JOB: [id number]/ [id number]

## 2023-04-06 NOTE — Anesthesia Postprocedure Evaluation (Signed)
Anesthesia Post Note  Patient: Kevin Lozano  Procedure(s) Performed: LEFT SHOULDER ARTHROSCOPY, DEBRIDEMENT, MINI OPEN BICEPS TENODESIS AND ROTATOR CUFF TEAR REPAIR (Left: Shoulder)     Patient location during evaluation: PACU Anesthesia Type: General Level of consciousness: awake and alert Pain management: pain level controlled Vital Signs Assessment: post-procedure vital signs reviewed and stable Respiratory status: spontaneous breathing, nonlabored ventilation and respiratory function stable Cardiovascular status: stable and blood pressure returned to baseline Anesthetic complications: no   No notable events documented.  Last Vitals:  Vitals:   04/06/23 1030 04/06/23 1045  BP: (!) 144/74 135/79  Pulse: (!) 53 (!) 57  Resp: 14 14  Temp:  36.4 C  SpO2: 100% 99%    Last Pain:  Vitals:   04/06/23 1045  TempSrc:   PainSc: 0-No pain                 Beryle Lathe

## 2023-04-06 NOTE — Brief Op Note (Signed)
   04/06/2023  10:19 AM  PATIENT:  Kevin Lozano  77 y.o. male  PRE-OPERATIVE DIAGNOSIS:  left shoulder rotator cuff tear, biceps tendinitis  POST-OPERATIVE DIAGNOSIS:  left shoulder rotator cuff tear, biceps tendinitis, shoulder synovitis and early humeral arthritis  PROCEDURE:  Procedure(s): LEFT SHOULDER ARTHROSCOPY, DEBRIDEMENT, MINI OPEN BICEPS TENODESIS AND ROTATOR CUFF TEAR REPAIR  SURGEON:  Surgeon(s): August Saucer, Corrie Mckusick, MD  ASSISTANT: Karenann Cai, PA  ANESTHESIA:   General  EBL: 25 ml    Total I/O In: 900 [I.V.:700; IV Piggyback:200] Out: -   BLOOD ADMINISTERED: none  DRAINS: None  LOCAL MEDICATIONS USED: Vancomycin  SPECIMEN:  No Specimen  COUNTS:  YES  TOURNIQUET:  * No tourniquets in log *  DICTATION: .Other Dictation: Dictation Number [id number]  PLAN OF CARE: Discharge to home after PACU  PATIENT DISPOSITION:  PACU - hemodynamically stable

## 2023-04-06 NOTE — H&P (Signed)
Kevin Lozano is an 77 y.o. male.   Chief Complaint: left shoulder pain HPI:  Kevin Lozano is a 77 y.o. male who presents to the office reporting left shoulder pain.  Since he was last seen has had an MRI scan.  Pain been going on for 5 months.  He did well with right shoulder rotator cuff tear repair in 2021.  He is a Teacher, English as a foreign language.  This affects his sleeping..  Affects other ADLs. No personal or family history of dvt or pe.  Past Medical History:  Diagnosis Date   Arthritis    COPD (chronic obstructive pulmonary disease)    Coronary artery calcification     Past Surgical History:  Procedure Laterality Date   CATARACT EXTRACTION Bilateral    HERNIA REPAIR Right 1971   inguinal   INGUINAL HERNIA REPAIR Left 08/01/2021   Procedure: OPEN LEFT INGUINAL HERNIA REPAIR WITH MESH;  Surgeon: Abigail Miyamoto, MD;  Location: [facility];  Service: General;  Laterality: Left;   ROTATOR CUFF REPAIR Right    TONSILLECTOMY     removed as a child   TOTAL KNEE ARTHROPLASTY Bilateral    WISDOM TOOTH EXTRACTION      Family History  Problem Relation Age of Onset   Hyperlipidemia Mother    Hyperlipidemia Father    Social History:  reports that he quit smoking 7 days ago. His smoking use included cigarettes. He has never used smokeless tobacco. He reports that he does not drink alcohol and does not use drugs.  Allergies:  Allergies  Allergen Reactions   Sulfa Antibiotics Rash    Medications Prior to Admission  Medication Sig Dispense Refill   acetaminophen (TYLENOL) 650 MG CR tablet Take 1,300 mg by mouth every 8 (eight) hours as needed for pain.     aspirin EC 81 MG tablet Take 1 tablet (81 mg total) by mouth daily. Swallow whole. 90 tablet 3   atorvastatin (LIPITOR) 20 MG tablet Take 20 mg by mouth daily.     Tolnaftate (ABSORBINE JR EX) Apply 1 Application topically daily as needed (pain).     triamcinolone cream (KENALOG) 0.1 % Apply 1 Application topically 2 (two) times daily.      No results  found for this or any previous visit (from the past 48 hour(s)). No results found.  Review of Systems  Musculoskeletal:  Positive for arthralgias.  All other systems reviewed and are negative.   Blood pressure 128/74, pulse 60, temperature 98.2 F (36.8 C), temperature source Oral, resp. rate 17, height 5\' 7"  (1.702 m), weight 70.8 kg, SpO2 98 %. Physical Exam Vitals reviewed.  HENT:     Head: Normocephalic.     Nose: Nose normal.     Mouth/Throat:     Mouth: Mucous membranes are moist.  Cardiovascular:     Rate and Rhythm: Normal rate.     Pulses: Normal pulses.  Pulmonary:     Effort: Pulmonary effort is normal.  Abdominal:     General: Abdomen is flat.  Musculoskeletal:     Cervical back: Normal range of motion.  Skin:    General: Skin is warm.     Capillary Refill: Capillary refill takes less than 2 seconds.  Neurological:     General: No focal deficit present.     Mental Status: He is alert.  Psychiatric:        Mood and Affect: Mood normal.     Ortho exam demonstrates good cervical spine range of motion.  On  the left-hand side patient does have a little coarseness with internal/external rotation at 90 degrees of abduction.  Passive range of motion is 60/95/170.  Has good subscap strength but slightly weaker external rotation on the left compared to the right.  No AC joint tenderness is present.   Assessment/Plan Impression is left shoulder rotator cuff tear. And biceps tendonitis There is a complete tear of the supraspinatus with about 3 cm of retraction. This is more retraction than he had on the right-hand side. Biceps tendinitis also present. Plan at this time after lengthy discussion with the patient is arthroscopy debridement biceps tendon release and tenodesis with rotator cuff tear repair. Risk benefits are discussed with the patient include not limited to infection or vessel damage incomplete restoration of function as well as possible failure of the repair.  Patient is a smoker which would make tissue quality less robust than others. This tear is also more retracted than the 1 on his right shoulder. That also increases the chance for failure. Could consider cuff mend depending on tissue quality. Patient understands risk and benefits. Plans to use CPM after surgery. All questions answered   Burnard Bunting, MD 04/06/2023, 6:29 AM

## 2023-04-06 NOTE — Anesthesia Procedure Notes (Signed)
Procedure Name: Intubation Date/Time: 04/06/2023 7:30 AM  Performed by: Caren Macadam, CRNAPre-anesthesia Checklist: Patient identified, Emergency Drugs available, Suction available and Patient being monitored Patient Re-evaluated:Patient Re-evaluated prior to induction Oxygen Delivery Method: Circle system utilized Preoxygenation: Pre-oxygenation with 100% oxygen Induction Type: IV induction Ventilation: Mask ventilation without difficulty Laryngoscope Size: Miller and 2 Grade View: Grade I Tube type: Oral Tube size: 7.5 mm Number of attempts: 1 Airway Equipment and Method: Stylet Placement Confirmation: ETT inserted through vocal cords under direct vision, positive ETCO2 and breath sounds checked- equal and bilateral Secured at: 23 cm Tube secured with: Tape Dental Injury: Teeth and Oropharynx as per pre-operative assessment

## 2023-04-06 NOTE — Anesthesia Procedure Notes (Signed)
Anesthesia Regional Block: Interscalene brachial plexus block   Pre-Anesthetic Checklist: , timeout performed,  Correct Patient, Correct Site, Correct Laterality,  Correct Procedure, Correct Position, site marked,  Risks and benefits discussed,  Surgical consent,  Pre-op evaluation,  At surgeon's request and post-op pain management  Laterality: Left  Prep: chloraprep       Needles:  Injection technique: Single-shot  Needle Type: Echogenic Needle     Needle Length: 5cm  Needle Gauge: 21     Additional Needles:   Narrative:  Start time: 04/06/2023 7:00 AM End time: 04/06/2023 7:03 AM Injection made incrementally with aspirations every 5 mL.  Performed by: Personally  Anesthesiologist: Beryle Lathe, MD  Additional Notes: No pain on injection. No increased resistance to injection. Injection made in 5cc increments. Good needle visualization. Patient tolerated the procedure well.

## 2023-04-06 NOTE — Transfer of Care (Signed)
Immediate Anesthesia Transfer of Care Note  Patient: Kevin Lozano  Procedure(s) Performed: LEFT SHOULDER ARTHROSCOPY, DEBRIDEMENT, MINI OPEN BICEPS TENODESIS AND ROTATOR CUFF TEAR REPAIR (Left: Shoulder)  Patient Location: PACU  Anesthesia Type:General  Level of Consciousness: drowsy  Airway & Oxygen Therapy: Patient Spontanous Breathing and Patient connected to nasal cannula oxygen  Post-op Assessment: Report given to RN and Post -op Vital signs reviewed and stable  Post vital signs: Reviewed and stable  Last Vitals:  Vitals Value Taken Time  BP 138/68 04/06/23 1015  Temp 36.2 C 04/06/23 1015  Pulse 59 04/06/23 1019  Resp 15 04/06/23 1019  SpO2 100 % 04/06/23 1019  Vitals shown include unvalidated device data.  Last Pain:  Vitals:   04/06/23 0624  TempSrc:   PainSc: 0-No pain         Complications: No notable events documented.

## 2023-04-07 ENCOUNTER — Encounter (HOSPITAL_COMMUNITY): Payer: Self-pay | Admitting: Orthopedic Surgery

## 2023-04-13 ENCOUNTER — Encounter: Payer: Self-pay | Admitting: Surgical

## 2023-04-13 ENCOUNTER — Ambulatory Visit (INDEPENDENT_AMBULATORY_CARE_PROVIDER_SITE_OTHER): Payer: PPO | Admitting: Surgical

## 2023-04-13 DIAGNOSIS — Z9889 Other specified postprocedural states: Secondary | ICD-10-CM

## 2023-04-13 NOTE — Progress Notes (Signed)
Post-Op Visit Note   Patient: Kevin Lozano           Date of Birth: November 04, 1946           MRN: [id number] Visit Date: 04/13/2023 PCP: Johny Blamer, MD   Assessment & Plan:  Chief Complaint:  Chief Complaint  Patient presents with   Left Shoulder - Routine Post Op   Visit Diagnoses: No diagnosis found.  Plan: [name] is a 77 y.o. male who presents s/p left shoulder rotator cuff repair  on 04/06/2023.  Patient is doing well and pain is overall controlled.  Up to 78 degrees on CPM machine.  Denies any chest pain, SOB, fevers, chills.  He has been able to wean off taking hydrocodone.  He takes occasional ibuprofen and muscle relaxers.  On exam, patient has range of motion 20 degrees external rotation, 70 degrees abduction, 80 degrees forward elevation passively.  Intact EPL, FPL, finger abduction, finger adduction, pronation/supination, bicep, tricep, deltoid of operative extremity.  Axillary nerve intact with deltoid firing.  Incisions are healing well without evidence of infection or dehiscence.  Sutures removed and replaced with Steri-Strips today.  2+ radial pulse of the operative extremity  Plan is continue with CPM machine.  Okay to go above 90 degrees.  No active range of motion of the shoulder or any lifting with the operative arm.  He understands this.  Will see him back in 2 weeks for clinical recheck with Dr. August Saucer and initiation of physical therapy at that time.  He would like to go upstairs where he went last time..   Follow-Up Instructions: No follow-ups on file.   Orders:  No orders of the defined types were placed in this encounter.  No orders of the defined types were placed in this encounter.   Imaging: No results found.  PMFS History: There are no problems to display for this patient.  Past Medical History:  Diagnosis Date   Arthritis    COPD (chronic obstructive pulmonary disease)    Coronary artery calcification     Family History  Problem  Relation Age of Onset   Hyperlipidemia Mother    Hyperlipidemia Father     Past Surgical History:  Procedure Laterality Date   CATARACT EXTRACTION Bilateral    HERNIA REPAIR Right 1971   inguinal   INGUINAL HERNIA REPAIR Left 08/01/2021   Procedure: OPEN LEFT INGUINAL HERNIA REPAIR WITH MESH;  Surgeon: Abigail Miyamoto, MD;  Location: [facility];  Service: General;  Laterality: Left;   ROTATOR CUFF REPAIR Right    SHOULDER ARTHROSCOPY WITH SUBACROMIAL DECOMPRESSION, ROTATOR CUFF REPAIR AND BICEP TENDON REPAIR Left 04/06/2023   Procedure: LEFT SHOULDER ARTHROSCOPY, DEBRIDEMENT, MINI OPEN BICEPS TENODESIS AND ROTATOR CUFF TEAR REPAIR;  Surgeon: Cammy Copa, MD;  Location: [facility];  Service: Orthopedics;  Laterality: Left;   TONSILLECTOMY     removed as a child   TOTAL KNEE ARTHROPLASTY Bilateral    WISDOM TOOTH EXTRACTION     Social History   Occupational History   Not on file  Tobacco Use   Smoking status: Former    Types: Cigarettes    Quit date: 03/30/2023    Years since quitting: 0.0   Smokeless tobacco: Never   Tobacco comments:    half pack daily-03/20/21-AH  Vaping Use   Vaping Use: Never used  Substance and Sexual Activity   Alcohol use: Never   Drug use: Never   Sexual activity: Not on file

## 2023-04-17 DIAGNOSIS — R030 Elevated blood-pressure reading, without diagnosis of hypertension: Secondary | ICD-10-CM | POA: Diagnosis not present

## 2023-04-17 DIAGNOSIS — J069 Acute upper respiratory infection, unspecified: Secondary | ICD-10-CM | POA: Diagnosis not present

## 2023-04-17 DIAGNOSIS — B9689 Other specified bacterial agents as the cause of diseases classified elsewhere: Secondary | ICD-10-CM | POA: Diagnosis not present

## 2023-04-17 DIAGNOSIS — R051 Acute cough: Secondary | ICD-10-CM | POA: Diagnosis not present

## 2023-04-19 DIAGNOSIS — S46012A Strain of muscle(s) and tendon(s) of the rotator cuff of left shoulder, initial encounter: Secondary | ICD-10-CM

## 2023-04-19 DIAGNOSIS — M7522 Bicipital tendinitis, left shoulder: Secondary | ICD-10-CM

## 2023-04-19 DIAGNOSIS — S43432A Superior glenoid labrum lesion of left shoulder, initial encounter: Secondary | ICD-10-CM

## 2023-04-29 ENCOUNTER — Ambulatory Visit (INDEPENDENT_AMBULATORY_CARE_PROVIDER_SITE_OTHER): Payer: PPO | Admitting: Orthopedic Surgery

## 2023-04-29 ENCOUNTER — Encounter: Payer: Self-pay | Admitting: Orthopedic Surgery

## 2023-04-29 DIAGNOSIS — Z9889 Other specified postprocedural states: Secondary | ICD-10-CM

## 2023-04-29 NOTE — Progress Notes (Signed)
   Post-Op Visit Note   Patient: Kevin Lozano           Date of Birth: August 23, 1946           MRN: [id number] Visit Date: 04/29/2023 PCP: Johny Blamer, MD   Assessment & Plan:  Chief Complaint:  Chief Complaint  Patient presents with   Left Shoulder - Routine Post Op     left shoulder rotator cuff repair  on 04/06/2023.   Visit Diagnoses:  1. S/P rotator cuff repair     Plan: Kevin Lozano is now 3-week status post left shoulder rotator cuff tear repair.  At up to 120 on CPM machine.  On examination there is no crepitus with passive shoulder range of motion.  Would like for him to start physical therapy with Arlys John upstairs for passive range of motion only for 3 weeks and then he can do active assisted range of motion and rotator cuff strengthening at the 6 weeks postop mark.  Takes occasional pain meds.  DC sling today.  Follow-up in 3 weeks with [facility].  Follow-Up Instructions: No follow-ups on file.   Orders:  Orders Placed This Encounter  Procedures   Ambulatory referral to Physical Therapy   No orders of the defined types were placed in this encounter.   Imaging: No results found.  PMFS History: Patient Active Problem List   Diagnosis Date Noted   Biceps tendonitis on left 04/19/2023   Traumatic complete tear of left rotator cuff 04/19/2023   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 04/19/2023   Past Medical History:  Diagnosis Date   Arthritis    COPD (chronic obstructive pulmonary disease) (HCC)    Coronary artery calcification     Family History  Problem Relation Age of Onset   Hyperlipidemia Mother    Hyperlipidemia Father     Past Surgical History:  Procedure Laterality Date   CATARACT EXTRACTION Bilateral    HERNIA REPAIR Right 1971   inguinal   INGUINAL HERNIA REPAIR Left 08/01/2021   Procedure: OPEN LEFT INGUINAL HERNIA REPAIR WITH MESH;  Surgeon: Abigail Miyamoto, MD;  Location: [facility];  Service: General;   Laterality: Left;   ROTATOR CUFF REPAIR Right    SHOULDER ARTHROSCOPY WITH SUBACROMIAL DECOMPRESSION, ROTATOR CUFF REPAIR AND BICEP TENDON REPAIR Left 04/06/2023   Procedure: LEFT SHOULDER ARTHROSCOPY, DEBRIDEMENT, MINI OPEN BICEPS TENODESIS AND ROTATOR CUFF TEAR REPAIR;  Surgeon: Cammy Copa, MD;  Location: [facility];  Service: Orthopedics;  Laterality: Left;   TONSILLECTOMY     removed as a child   TOTAL KNEE ARTHROPLASTY Bilateral    WISDOM TOOTH EXTRACTION     Social History   Occupational History   Not on file  Tobacco Use   Smoking status: Former    Types: Cigarettes    Quit date: 03/30/2023    Years since quitting: 0.0   Smokeless tobacco: Never   Tobacco comments:    half pack daily-03/20/21-AH  Vaping Use   Vaping Use: Never used  Substance and Sexual Activity   Alcohol use: Never   Drug use: Never   Sexual activity: Not on file

## 2023-04-30 ENCOUNTER — Telehealth: Payer: Self-pay

## 2023-04-30 ENCOUNTER — Telehealth: Payer: Self-pay | Admitting: Surgical

## 2023-04-30 NOTE — Telephone Encounter (Signed)
Pls see note from Dr Dean 

## 2023-04-30 NOTE — Telephone Encounter (Signed)
Note: Called patient left message on voicemail to return call advised appointment was rescheduled with [facility] for 05/20/2023 at 2:45 pm  Appointment time did not change just provider was changed

## 2023-04-30 NOTE — Telephone Encounter (Signed)
-----   Message from Cammy Copa, MD sent at 04/29/2023  8:57 PM EDT ----- Lawson Fiscal can you have Eugen follow-up in 3 weeks with [facility] thanks

## 2023-05-06 ENCOUNTER — Ambulatory Visit: Payer: PPO | Admitting: Physical Therapy

## 2023-05-06 ENCOUNTER — Encounter: Payer: Self-pay | Admitting: Physical Therapy

## 2023-05-06 ENCOUNTER — Other Ambulatory Visit: Payer: Self-pay

## 2023-05-06 DIAGNOSIS — R600 Localized edema: Secondary | ICD-10-CM

## 2023-05-06 DIAGNOSIS — M25512 Pain in left shoulder: Secondary | ICD-10-CM

## 2023-05-06 DIAGNOSIS — M6281 Muscle weakness (generalized): Secondary | ICD-10-CM

## 2023-05-06 NOTE — Therapy (Signed)
OUTPATIENT PHYSICAL THERAPY SHOULDER EVALUATION   Patient Name: Kevin Lozano MRN: [id number] DOB:[birth year], 77 y.o., male Today's Date: 05/06/2023  END OF SESSION:  PT End of Session - 05/06/23 1046     Visit Number 1    Number of Visits 12    Date for PT Re-Evaluation 07/01/23    Progress Note Due on Visit 10    PT Start Time 1010    PT Stop Time 1047    PT Time Calculation (min) 37 min    Activity Tolerance Patient tolerated treatment well    Behavior During Therapy WFL for tasks assessed/performed             Past Medical History:  Diagnosis Date   Arthritis    COPD (chronic obstructive pulmonary disease) (HCC)    Coronary artery calcification    Past Surgical History:  Procedure Laterality Date   CATARACT EXTRACTION Bilateral    HERNIA REPAIR Right 1971   inguinal   INGUINAL HERNIA REPAIR Left 08/01/2021   Procedure: OPEN LEFT INGUINAL HERNIA REPAIR WITH MESH;  Surgeon: Abigail Miyamoto, MD;  Location: [facility];  Service: General;  Laterality: Left;   ROTATOR CUFF REPAIR Right    SHOULDER ARTHROSCOPY WITH SUBACROMIAL DECOMPRESSION, ROTATOR CUFF REPAIR AND BICEP TENDON REPAIR Left 04/06/2023   Procedure: LEFT SHOULDER ARTHROSCOPY, DEBRIDEMENT, MINI OPEN BICEPS TENODESIS AND ROTATOR CUFF TEAR REPAIR;  Surgeon: Cammy Copa, MD;  Location: [facility];  Service: Orthopedics;  Laterality: Left;   TONSILLECTOMY     removed as a child   TOTAL KNEE ARTHROPLASTY Bilateral    WISDOM TOOTH EXTRACTION     Patient Active Problem List   Diagnosis Date Noted   Biceps tendonitis on left 04/19/2023   Traumatic complete tear of left rotator cuff 04/19/2023   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 04/19/2023    PCP: Johny Blamer, MD   REFERRING PROVIDER: Cammy Copa, MD  REFERRING DIAG: [phone] (ICD-10-CM) - S/P rotator cuff repair  THERAPY DIAG:  Acute pain of left shoulder  Muscle weakness  (generalized)  Localized edema  Rationale for Evaluation and Treatment: Rehabilitation  ONSET DATE: Lt RTC repair and biceps tenodesis 04/06/23  SUBJECTIVE:                                                                                                                                                                                      SUBJECTIVE STATEMENT: He had surgery for Lt shoulder RTC repair and has been using CPM machine but they have taken that now. He relays pain has bene okay so far and that his other shoulder is doing good (had  previous Rt RTC repair)  Hand dominance: Right  PERTINENT HISTORY: PROM initially and No AROM or biceps resistance until 05/18/23 [id number] RTC repair, COPD, bilat TKA PAIN:  Are you having pain? Yes: NPRS scale: 5/10 Pain location: Rt shoulder Pain description: dull Aggravating factors: putting on shirt Relieving factors: getting his shoulder in right position  PRECAUTIONS: Shoulder  WEIGHT BEARING RESTRICTIONS: No  FALLS:  Has patient fallen in last 6 months? No  OCCUPATION: retired  PLOF: Independent  PATIENT GOALS:Get back to playing Golf  NEXT MD VISIT: 05/20/23  OBJECTIVE:   DIAGNOSTIC FINDINGS:    PATIENT SURVEYS:  Eval: FOTO 44% functional, goal is 63%  COGNITION: Overall cognitive status: Within functional limits for tasks assessed     SENSATION: WFL    UPPER EXTREMITY ROM:   Passive ROM Left eval   Shoulder flexion 140   Shoulder extension    Shoulder abduction 140   Shoulder adduction    Shoulder internal rotation 40 At 70 deg abd   Shoulder external rotation 38 At 45 deg abd   Elbow flexion    Elbow extension    Wrist flexion    Wrist extension    Wrist ulnar deviation    Wrist radial deviation    Wrist pronation    Wrist supination    (Blank rows = not tested)  UPPER EXTREMITY MMT: Deferred at eval due to post op restrictions  MMT Right  Left   Shoulder flexion    Shoulder extension     Shoulder abduction    Shoulder adduction    Shoulder internal rotation    Shoulder external rotation    Middle trapezius    Lower trapezius    Elbow flexion    Elbow extension    Wrist flexion    Wrist extension    Wrist ulnar deviation    Wrist radial deviation    Wrist pronation    Wrist supination    Grip strength (lbs)    (Blank rows = not tested)    TODAY'S TREATMENT:  Eval HEP creation and review with demonstration and trial set preformed, see below for details Rt shoulder PROM gentle to tolerance all planes    PATIENT EDUCATION: Education details: HEP, PT plan of care Person educated: Patient Education method: Explanation, Demonstration, Verbal cues, and Handouts Education comprehension: verbalized understanding and needs further education   HOME EXERCISE PROGRAM: Access Code: [email or web address] URL: [email or web address] Date: 05/06/2023 Prepared by: Ivery Quale  Exercises - Circular Shoulder Pendulum with Table Support  - 3 x daily - 7 x weekly - 1 sets - 20 reps - Seated Shoulder Abduction Towel Slide at Table Top  - 3 x daily - 6 x weekly - 1 sets - 10-15 reps - 5 sec hold - Seated Shoulder Flexion Towel Slide at Table Top  - 1 x daily - 6 x weekly - 1 sets - 10-15 reps - 5 sec hold - Seated Shoulder External Rotation PROM on Table  - 3 x daily - 6 x weekly - 1 sets - 10-15 reps - 5 sec hold - Seated Gripping Towel  - 3 x daily - 6 x weekly - 1 sets - 20 reps - Supine Shoulder Internal Rotation Stretch  - 3 x daily - 6 x weekly - 1 sets - 10 reps - 5 sec hold  ASSESSMENT:  CLINICAL IMPRESSION: Patient referred to PT for Lt RTC repair and biceps tenodesis 04/06/23. He is PROM x 2 weeks then [facility] and RC  strengthening at 6 wks post op. Patient will benefit from skilled PT to address below impairments, limitations and improve overall function.  OBJECTIVE IMPAIRMENTS: decreased activity tolerance, decreased shoulder mobility, decreased ROM, decreased  strength, impaired flexibility, impaired UE use, and pain.  ACTIVITY LIMITATIONS: reaching, lifting, carry,  cleaning, driving, sleeping  PERSONAL FACTORS:  [id number] RTC repair, COPD, bilat TKAalso affecting patient's functional outcome.  REHAB POTENTIAL: Good  CLINICAL DECISION MAKING: Stable/uncomplicated  EVALUATION COMPLEXITY: Low    GOALS: Short term PT Goals Target date: 06/03/2023   Pt will be I and compliant with HEP. Baseline:  Goal status: New Pt will improve left shoulder PROM to [facility] Baseline: Goal status: New  Long term PT goals Target date:07/01/2023   Pt will improve Lt shoulder AROM to [facility] to improve functional reaching Baseline: Goal status: New Pt will improve  Lt shoulder strength to at least 4+/5 MMT to improve functional strength Baseline: Goal status: New Pt will improve FOTO to at least 63% functional to show improved function Baseline: Goal status: New Pt will reduce pain to overall less than 3/10 with usual activity. Baseline: Goal status: New  PLAN: PT FREQUENCY: 1-2 times per week   PT DURATION: 6-8 weeks  PLANNED INTERVENTIONS (unless contraindicated): aquatic PT, Canalith repositioning, cryotherapy, Electrical stimulation, Iontophoresis with 4 mg/ml dexamethasome, Moist heat, traction, Ultrasound, gait training, Therapeutic exercise, balance training, neuromuscular re-education, patient/family education, prosthetic training, manual techniques, passive ROM, dry needling, taping, vasopnuematic device, vestibular, spinal manipulations, joint manipulations  PLAN FOR NEXT SESSION: review HEP, PROM initially and No AROM or biceps resistance until 05/18/23    April Manson, PT,DPT 05/06/2023, 10:47 AM

## 2023-05-14 ENCOUNTER — Encounter: Payer: Self-pay | Admitting: Physical Therapy

## 2023-05-14 ENCOUNTER — Ambulatory Visit: Payer: PPO | Admitting: Physical Therapy

## 2023-05-14 DIAGNOSIS — M25512 Pain in left shoulder: Secondary | ICD-10-CM | POA: Diagnosis not present

## 2023-05-14 DIAGNOSIS — M6281 Muscle weakness (generalized): Secondary | ICD-10-CM

## 2023-05-14 DIAGNOSIS — R600 Localized edema: Secondary | ICD-10-CM | POA: Diagnosis not present

## 2023-05-14 NOTE — Therapy (Signed)
OUTPATIENT PHYSICAL THERAPY TREATMENT   Patient Name: Kevin Lozano MRN: [id number] DOB:1946/07/01, 77 y.o., male Today's Date: 05/14/2023  END OF SESSION:  PT End of Session - 05/14/23 1051     Visit Number 2    Number of Visits 12    Date for PT Re-Evaluation 07/01/23    Progress Note Due on Visit 10    PT Start Time 1050    PT Stop Time 1130    PT Time Calculation (min) 40 min    Activity Tolerance Patient tolerated treatment well    Behavior During Therapy WFL for tasks assessed/performed             Past Medical History:  Diagnosis Date   Arthritis    COPD (chronic obstructive pulmonary disease) (HCC)    Coronary artery calcification    Past Surgical History:  Procedure Laterality Date   CATARACT EXTRACTION Bilateral    HERNIA REPAIR Right 1971   inguinal   INGUINAL HERNIA REPAIR Left 08/01/2021   Procedure: OPEN LEFT INGUINAL HERNIA REPAIR WITH MESH;  Surgeon: Abigail Miyamoto, MD;  Location: [facility];  Service: General;  Laterality: Left;   ROTATOR CUFF REPAIR Right    SHOULDER ARTHROSCOPY WITH SUBACROMIAL DECOMPRESSION, ROTATOR CUFF REPAIR AND BICEP TENDON REPAIR Left 04/06/2023   Procedure: LEFT SHOULDER ARTHROSCOPY, DEBRIDEMENT, MINI OPEN BICEPS TENODESIS AND ROTATOR CUFF TEAR REPAIR;  Surgeon: Cammy Copa, MD;  Location: [facility];  Service: Orthopedics;  Laterality: Left;   TONSILLECTOMY     removed as a child   TOTAL KNEE ARTHROPLASTY Bilateral    WISDOM TOOTH EXTRACTION     Patient Active Problem List   Diagnosis Date Noted   Biceps tendonitis on left 04/19/2023   Traumatic complete tear of left rotator cuff 04/19/2023   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 04/19/2023    PCP: Johny Blamer, MD   REFERRING PROVIDER: Cammy Copa, MD  REFERRING DIAG: [phone] (ICD-10-CM) - S/P rotator cuff repair  THERAPY DIAG:  Acute pain of left shoulder  Muscle weakness (generalized)  Localized  edema  Rationale for Evaluation and Treatment: Rehabilitation  ONSET DATE: Lt RTC repair and biceps tenodesis 04/06/23  SUBJECTIVE:                                                                                                                                                                                      SUBJECTIVE STATEMENT: He relays pain is doing good, he was been doing the exercises  Hand dominance: Right  PERTINENT HISTORY: PROM initially and No AROM or biceps resistance until 05/18/23 [id number] RTC repair, COPD, bilat TKA PAIN:  Are  you having pain? Yes: NPRS scale: 3/10 Pain location: Rt shoulder Pain description: dull Aggravating factors: putting on shirt Relieving factors: getting his shoulder in right position  PRECAUTIONS: Shoulder  WEIGHT BEARING RESTRICTIONS: No  FALLS:  Has patient fallen in last 6 months? No  OCCUPATION: retired  PLOF: Independent  PATIENT GOALS:Get back to playing Golf  NEXT MD VISIT: 05/20/23  OBJECTIVE:   DIAGNOSTIC FINDINGS:    PATIENT SURVEYS:  Eval: FOTO 44% functional, goal is 63%  COGNITION: Overall cognitive status: Within functional limits for tasks assessed     SENSATION: WFL    UPPER EXTREMITY ROM:   Passive ROM Left eval Left 05/14/23  Shoulder flexion 140 150  Shoulder extension    Shoulder abduction 140 160  Shoulder adduction    Shoulder internal rotation 40 At 70 deg abd   Shoulder external rotation 38 At 45 deg abd 66 At 70 deg abd  Elbow flexion    Elbow extension    Wrist flexion    Wrist extension    Wrist ulnar deviation    Wrist radial deviation    Wrist pronation    Wrist supination    (Blank rows = not tested)  UPPER EXTREMITY MMT: Deferred at eval due to post op restrictions  MMT Right  Left   Shoulder flexion    Shoulder extension    Shoulder abduction    Shoulder adduction    Shoulder internal rotation    Shoulder external rotation    Middle trapezius    Lower  trapezius    Elbow flexion    Elbow extension    Wrist flexion    Wrist extension    Wrist ulnar deviation    Wrist radial deviation    Wrist pronation    Wrist supination    Grip strength (lbs)    (Blank rows = not tested)    TODAY'S TREATMENT:  05/14/23 Rt shoulder PROM gentle to tolerance all planes X 15 min Pulleys PROM/AAROM 3 min flexion, 3 min abd Supine wand flexion AAROM 5 sec X 10 Supine wand abd AAROM 5 sec X 10 Supine wand ER AAROM 5 sec X 10 Gripping green digi grip X 20 Pendulums X 20 CW and CCW   PATIENT EDUCATION: Education details: HEP, PT plan of care Person educated: Patient Education method: Explanation, Demonstration, Verbal cues, and Handouts Education comprehension: verbalized understanding and needs further education   HOME EXERCISE PROGRAM: Access Code: [email or web address] URL: [email or web address] Date: 05/14/2023 Prepared by: Ivery Quale  Exercises - Circular Shoulder Pendulum with Table Support  - 3 x daily - 7 x weekly - 1 sets - 20 reps - Supine Shoulder Flexion Extension AAROM with Dowel  - 2 x daily - 6 x weekly - 1-2 sets - 10 reps - 5 sec hold - Supine Shoulder Abduction AAROM with Dowel  - 2 x daily - 6 x weekly - 1-2 sets - 10 reps - 5 sec hold - Supine Shoulder Internal Rotation AAROM Stretch  - 3 x daily - 6 x weekly - 1-2 sets - 10 reps - 5 sec hold - Supine Shoulder External Rotation in 45 Degrees Abduction AAROM with Dowel  - 2 x daily - 6 x weekly - 1-2 sets - 10 reps - 5 sec hold - Seated Gripping Towel  - 2 x daily - 6 x weekly - 1 sets - 20 reps ASSESSMENT:  CLINICAL IMPRESSION: He has made great early progress with his PROM/AAROM. He will be 6 weeks  post op on 05/18/23 and will wait until then to begin any AROM.   OBJECTIVE IMPAIRMENTS: decreased activity tolerance, decreased shoulder mobility, decreased ROM, decreased strength, impaired flexibility, impaired UE use, and pain.  ACTIVITY LIMITATIONS: reaching, lifting,  carry,  cleaning, driving, sleeping  PERSONAL FACTORS:  ZOX:WRUEA RTC repair, COPD, bilat TKAalso affecting patient's functional outcome.  REHAB POTENTIAL: Good  CLINICAL DECISION MAKING: Stable/uncomplicated  EVALUATION COMPLEXITY: Low    GOALS: Short term PT Goals Target date: 06/03/2023   Pt will be I and compliant with HEP. Baseline:  Goal status: New Pt will improve left shoulder PROM to [facility] Baseline: Goal status: New  Long term PT goals Target date:07/01/2023   Pt will improve Lt shoulder AROM to [facility] to improve functional reaching Baseline: Goal status: New Pt will improve  Lt shoulder strength to at least 4+/5 MMT to improve functional strength Baseline: Goal status: New Pt will improve FOTO to at least 63% functional to show improved function Baseline: Goal status: New Pt will reduce pain to overall less than 3/10 with usual activity. Baseline: Goal status: New  PLAN: PT FREQUENCY: 1-2 times per week   PT DURATION: 6-8 weeks  PLANNED INTERVENTIONS (unless contraindicated): aquatic PT, Canalith repositioning, cryotherapy, Electrical stimulation, Iontophoresis with 4 mg/ml dexamethasome, Moist heat, traction, Ultrasound, gait training, Therapeutic exercise, balance training, neuromuscular re-education, patient/family education, prosthetic training, manual techniques, passive ROM, dry needling, taping, vasopnuematic device, vestibular, spinal manipulations, joint manipulations  PLAN FOR NEXT SESSION: , PROM initially and No AROM or biceps resistance until 05/18/23 so we can progress his HEP again for AROM exercises next week if pain allows.     April Manson, PT,DPT 05/14/2023, 10:52 AM

## 2023-05-19 ENCOUNTER — Encounter: Payer: PPO | Admitting: Rehabilitative and Restorative Service Providers"

## 2023-05-20 ENCOUNTER — Encounter: Payer: PPO | Admitting: Orthopedic Surgery

## 2023-05-20 ENCOUNTER — Ambulatory Visit (INDEPENDENT_AMBULATORY_CARE_PROVIDER_SITE_OTHER): Payer: PPO | Admitting: Surgical

## 2023-05-20 ENCOUNTER — Ambulatory Visit (INDEPENDENT_AMBULATORY_CARE_PROVIDER_SITE_OTHER): Payer: PPO | Admitting: Rehabilitative and Restorative Service Providers"

## 2023-05-20 ENCOUNTER — Encounter: Payer: Self-pay | Admitting: Rehabilitative and Restorative Service Providers"

## 2023-05-20 ENCOUNTER — Encounter: Payer: Self-pay | Admitting: Surgical

## 2023-05-20 DIAGNOSIS — Z9889 Other specified postprocedural states: Secondary | ICD-10-CM

## 2023-05-20 DIAGNOSIS — R600 Localized edema: Secondary | ICD-10-CM | POA: Diagnosis not present

## 2023-05-20 DIAGNOSIS — M6281 Muscle weakness (generalized): Secondary | ICD-10-CM | POA: Diagnosis not present

## 2023-05-20 DIAGNOSIS — M25512 Pain in left shoulder: Secondary | ICD-10-CM

## 2023-05-20 NOTE — Therapy (Signed)
OUTPATIENT PHYSICAL THERAPY TREATMENT   Patient Name: Kevin Lozano MRN: [id number] DOB:1946/09/23, 77 y.o., male Today's Date: 05/20/2023  END OF SESSION:  PT End of Session - 05/20/23 1604     Visit Number 3    Number of Visits 12    Date for PT Re-Evaluation 07/01/23    Progress Note Due on Visit 10    PT Start Time 1543    PT Stop Time 1602    PT Time Calculation (min) 19 min    Activity Tolerance Patient tolerated treatment well;No increased pain    Behavior During Therapy WFL for tasks assessed/performed              Past Medical History:  Diagnosis Date   Arthritis    COPD (chronic obstructive pulmonary disease) (HCC)    Coronary artery calcification    Past Surgical History:  Procedure Laterality Date   CATARACT EXTRACTION Bilateral    HERNIA REPAIR Right 1971   inguinal   INGUINAL HERNIA REPAIR Left 08/01/2021   Procedure: OPEN LEFT INGUINAL HERNIA REPAIR WITH MESH;  Surgeon: Abigail Miyamoto, MD;  Location: [facility];  Service: General;  Laterality: Left;   ROTATOR CUFF REPAIR Right    SHOULDER ARTHROSCOPY WITH SUBACROMIAL DECOMPRESSION, ROTATOR CUFF REPAIR AND BICEP TENDON REPAIR Left 04/06/2023   Procedure: LEFT SHOULDER ARTHROSCOPY, DEBRIDEMENT, MINI OPEN BICEPS TENODESIS AND ROTATOR CUFF TEAR REPAIR;  Surgeon: Cammy Copa, MD;  Location: [facility];  Service: Orthopedics;  Laterality: Left;   TONSILLECTOMY     removed as a child   TOTAL KNEE ARTHROPLASTY Bilateral    WISDOM TOOTH EXTRACTION     Patient Active Problem List   Diagnosis Date Noted   Biceps tendonitis on left 04/19/2023   Traumatic complete tear of left rotator cuff 04/19/2023   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 04/19/2023    PCP: Kevin Blamer, MD   REFERRING PROVIDER: Cammy Copa, MD  REFERRING DIAG: [phone] (ICD-10-CM) - S/P rotator cuff repair  THERAPY DIAG:  Acute pain of left shoulder  Muscle weakness  (generalized)  Localized edema  Rationale for Evaluation and Treatment: Rehabilitation  ONSET DATE: Lt RTC repair and biceps tenodesis 04/06/23  SUBJECTIVE:                                                                                                                                                                                      SUBJECTIVE STATEMENT: [name] is still sleeping the the recliner.  No prescription meds in a while.  Short appointment today due to being late from his doctor appointment.  Hand dominance: Right  PERTINENT HISTORY: PROM initially  and No AROM or biceps resistance until 05/18/23 ZOX:WRUEA RTC repair, COPD, bilat TKA PAIN:  Are you having pain? Yes: NPRS scale: 3/10 Pain location: Rt shoulder Pain description: dull Aggravating factors: putting on shirt Relieving factors: getting his shoulder in right position  PRECAUTIONS: Shoulder  WEIGHT BEARING RESTRICTIONS: No  FALLS:  Has patient fallen in last 6 months? No  OCCUPATION: retired  PLOF: Independent  PATIENT GOALS:Get back to playing Golf  NEXT MD VISIT: 05/20/23  OBJECTIVE:   DIAGNOSTIC FINDINGS:    PATIENT SURVEYS:  Eval: FOTO 44% functional, goal is 63%  COGNITION: Overall cognitive status: Within functional limits for tasks assessed     SENSATION: [facility]    UPPER EXTREMITY ROM:   Passive ROM Left eval Left 05/14/23 Left 05/20/2023  Shoulder flexion 140 150 140 (supine)  Shoulder extension     Shoulder abduction 140 160   Shoulder adduction     Shoulder internal rotation 40 At 70 deg abd  40 (supine)  Shoulder external rotation 38 At 45 deg abd 66 At 70 deg abd 70 (supine)  Elbow flexion     Elbow extension     Wrist flexion     Wrist extension     Wrist ulnar deviation     Wrist radial deviation     Wrist pronation     Wrist supination     (Blank rows = not tested)  UPPER EXTREMITY MMT: Deferred at eval due to post op restrictions  MMT Right  Left    Shoulder flexion    Shoulder extension    Shoulder abduction    Shoulder adduction    Shoulder internal rotation    Shoulder external rotation    Middle trapezius    Lower trapezius    Elbow flexion    Elbow extension    Wrist flexion    Wrist extension    Wrist ulnar deviation    Wrist radial deviation    Wrist pronation    Wrist supination    Grip strength (lbs)    (Blank rows = not tested)    TODAY'S TREATMENT:  05/20/2023 Scapular retraction 10X 5 seconds Supine arm raises (scapular protraction) 20X 3 seconds (no weights yet) Thumb up the back (shoulder IR) stretch 10X 10 seconds   05/14/23 Rt shoulder PROM gentle to tolerance all planes X 15 min Pulleys PROM/AAROM 3 min flexion, 3 min abd Supine wand flexion AAROM 5 sec X 10 Supine wand abd AAROM 5 sec X 10 Supine wand ER AAROM 5 sec X 10 Gripping green digi grip X 20 Pendulums X 20 CW and CCW   PATIENT EDUCATION: Education details: HEP, PT plan of care Person educated: Patient Education method: Explanation, Demonstration, Verbal cues, and Handouts Education comprehension: verbalized understanding and needs further education   HOME EXERCISE PROGRAM: Access Code: [email or web address] URL: [email or web address] Date: 05/14/2023 Prepared by: Ivery Quale  Exercises - Circular Shoulder Pendulum with Table Support  - 3 x daily - 7 x weekly - 1 sets - 20 reps - Supine Shoulder Flexion Extension AAROM with Dowel  - 2 x daily - 6 x weekly - 1-2 sets - 10 reps - 5 sec hold - Supine Shoulder Abduction AAROM with Dowel  - 2 x daily - 6 x weekly - 1-2 sets - 10 reps - 5 sec hold - Supine Shoulder Internal Rotation AAROM Stretch  - 3 x daily - 6 x weekly - 1-2 sets - 10 reps - 5 sec hold - Supine Shoulder  External Rotation in 45 Degrees Abduction AAROM with Dowel  - 2 x daily - 6 x weekly - 1-2 sets - 10 reps - 5 sec hold - Seated Gripping Towel  - 2 x daily - 6 x weekly - 1 sets - 20 reps ASSESSMENT:  CLINICAL  IMPRESSION: Short appointment today due to a late arrival from his appointment with Dr. August Saucer.  [name] has been given the okay to progress into appropriate strength work and AROM.  The focus today was on scapular strengthening and IR AROM.  We added 3 activities to his current home exercise program and will focus more on AROM over the next few weeks with gradual progression into strengthening as appropriate.  OBJECTIVE IMPAIRMENTS: decreased activity tolerance, decreased shoulder mobility, decreased ROM, decreased strength, impaired flexibility, impaired UE use, and pain.  ACTIVITY LIMITATIONS: reaching, lifting, carry,  cleaning, driving, sleeping  PERSONAL FACTORS:  HYQ:MVHQI RTC repair, COPD, bilat TKAalso affecting patient's functional outcome.  REHAB POTENTIAL: Good  CLINICAL DECISION MAKING: Stable/uncomplicated  EVALUATION COMPLEXITY: Low    GOALS: Short term PT Goals Target date: 06/03/2023   Pt will be I and compliant with HEP. Baseline:  Goal status: On going 05/20/2023 Pt will improve left shoulder PROM to [facility] Baseline: Goal status: On going 05/20/2023  Long term PT goals Target date:07/01/2023   Pt will improve Lt shoulder AROM to [facility] to improve functional reaching Baseline: Goal status: New Pt will improve  Lt shoulder strength to at least 4+/5 MMT to improve functional strength Baseline: Goal status: New Pt will improve FOTO to at least 63% functional to show improved function Baseline: Goal status: New Pt will reduce pain to overall less than 3/10 with usual activity. Baseline: Goal status: New  PLAN: PT FREQUENCY: 1-2 times per week   PT DURATION: 6-8 weeks  PLANNED INTERVENTIONS (unless contraindicated): aquatic PT, Canalith repositioning, cryotherapy, Electrical stimulation, Iontophoresis with 4 mg/ml dexamethasome, Moist heat, traction, Ultrasound, gait training, Therapeutic exercise, balance training, neuromuscular re-education, patient/family education,  prosthetic training, manual techniques, passive ROM, dry needling, taping, vasopnuematic device, vestibular, spinal manipulations, joint manipulations  PLAN FOR NEXT SESSION: AROM and appropriate, gradual biceps resistance after 05/18/23.  Emphasis is shifting to AROM exercises.     Cherlyn Cushing, PT, MPT 05/20/2023, 4:11 PM

## 2023-05-20 NOTE — Progress Notes (Signed)
Post-Op Visit Note   Patient: Kevin Lozano           Date of Birth: [birth year]           MRN: [id number] Visit Date: 05/20/2023 PCP: Johny Blamer, MD   Assessment & Plan:  Chief Complaint:  Chief Complaint  Patient presents with   Left Shoulder - Routine Post Op       left shoulder rotator cuff repair  on 04/06/2023.      Visit Diagnoses:  1. S/P rotator cuff repair     Plan: Patient is a 77 year old male who presents s/p left shoulder rotator cuff tear repair on 04/06/2023.  Doing well overall.  Still has a little bit of pain but nothing he is concerned about.  Going to physical therapy upstairs.  States that with each therapy appointment, everything gets better and better.  He feels he is happy with his progress.  He has not returned to golfing yet.  On exam, patient has 45 degrees X rotation, 90 degrees abduction, 125 degrees forward elevation passively and actively.  He has incisions that are well-healed.  Good rotator cuff strength rated 5/5 of supra, infra, subscap.  No weakness with bicep flexion or supination.  2+ radial pulse of the operative extremity.  Plan is to continue with physical therapy upstairs.  Okay to start some strengthening exercises and follow-up for clinical recheck with Dr. August Saucer in 6 weeks.  Ease back into golfing at that time.  Follow-Up Instructions: No follow-ups on file.   Orders:  No orders of the defined types were placed in this encounter.  No orders of the defined types were placed in this encounter.   Imaging: No results found.  PMFS History: Patient Active Problem List   Diagnosis Date Noted   Biceps tendonitis on left 04/19/2023   Traumatic complete tear of left rotator cuff 04/19/2023   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 04/19/2023   Past Medical History:  Diagnosis Date   Arthritis    COPD (chronic obstructive pulmonary disease) (HCC)    Coronary artery calcification     Family  History  Problem Relation Age of Onset   Hyperlipidemia Mother    Hyperlipidemia Father     Past Surgical History:  Procedure Laterality Date   CATARACT EXTRACTION Bilateral    HERNIA REPAIR Right 1971   inguinal   INGUINAL HERNIA REPAIR Left 08/01/2021   Procedure: OPEN LEFT INGUINAL HERNIA REPAIR WITH MESH;  Surgeon: Abigail Miyamoto, MD;  Location: [facility];  Service: General;  Laterality: Left;   ROTATOR CUFF REPAIR Right    SHOULDER ARTHROSCOPY WITH SUBACROMIAL DECOMPRESSION, ROTATOR CUFF REPAIR AND BICEP TENDON REPAIR Left 04/06/2023   Procedure: LEFT SHOULDER ARTHROSCOPY, DEBRIDEMENT, MINI OPEN BICEPS TENODESIS AND ROTATOR CUFF TEAR REPAIR;  Surgeon: Cammy Copa, MD;  Location: [facility];  Service: Orthopedics;  Laterality: Left;   TONSILLECTOMY     removed as a child   TOTAL KNEE ARTHROPLASTY Bilateral    WISDOM TOOTH EXTRACTION     Social History   Occupational History   Not on file  Tobacco Use   Smoking status: Former    Types: Cigarettes    Quit date: 03/30/2023    Years since quitting: 0.1   Smokeless tobacco: Never   Tobacco comments:    half pack daily-03/20/21-AH  Vaping Use   Vaping Use: Never used  Substance and Sexual Activity   Alcohol use: Never   Drug use: Never  Sexual activity: Not on file

## 2023-05-26 ENCOUNTER — Ambulatory Visit: Payer: PPO | Admitting: Physical Therapy

## 2023-05-26 ENCOUNTER — Encounter: Payer: Self-pay | Admitting: Physical Therapy

## 2023-05-26 DIAGNOSIS — M6281 Muscle weakness (generalized): Secondary | ICD-10-CM

## 2023-05-26 DIAGNOSIS — M25512 Pain in left shoulder: Secondary | ICD-10-CM

## 2023-05-26 DIAGNOSIS — R600 Localized edema: Secondary | ICD-10-CM | POA: Diagnosis not present

## 2023-05-26 NOTE — Therapy (Signed)
OUTPATIENT PHYSICAL THERAPY TREATMENT   Patient Name: Kevin Lozano MRN: [id number] DOB:1946-01-05, 77 y.o., male Today's Date: 05/26/2023  END OF SESSION:  PT End of Session - 05/26/23 1018     Visit Number 4    Number of Visits 12    Date for PT Re-Evaluation 07/01/23    Progress Note Due on Visit 10    PT Start Time 1015    PT Stop Time 1055    PT Time Calculation (min) 40 min    Activity Tolerance Patient tolerated treatment well;No increased pain    Behavior During Therapy WFL for tasks assessed/performed              Past Medical History:  Diagnosis Date   Arthritis    COPD (chronic obstructive pulmonary disease) (HCC)    Coronary artery calcification    Past Surgical History:  Procedure Laterality Date   CATARACT EXTRACTION Bilateral    HERNIA REPAIR Right 1971   inguinal   INGUINAL HERNIA REPAIR Left 08/01/2021   Procedure: OPEN LEFT INGUINAL HERNIA REPAIR WITH MESH;  Surgeon: Abigail Miyamoto, MD;  Location: [facility];  Service: General;  Laterality: Left;   ROTATOR CUFF REPAIR Right    SHOULDER ARTHROSCOPY WITH SUBACROMIAL DECOMPRESSION, ROTATOR CUFF REPAIR AND BICEP TENDON REPAIR Left 04/06/2023   Procedure: LEFT SHOULDER ARTHROSCOPY, DEBRIDEMENT, MINI OPEN BICEPS TENODESIS AND ROTATOR CUFF TEAR REPAIR;  Surgeon: Cammy Copa, MD;  Location: [facility];  Service: Orthopedics;  Laterality: Left;   TONSILLECTOMY     removed as a child   TOTAL KNEE ARTHROPLASTY Bilateral    WISDOM TOOTH EXTRACTION     Patient Active Problem List   Diagnosis Date Noted   Biceps tendonitis on left 04/19/2023   Traumatic complete tear of left rotator cuff 04/19/2023   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 04/19/2023    PCP: Johny Blamer, MD   REFERRING PROVIDER: Cammy Copa, MD  REFERRING DIAG: [id number] (ICD-10-CM) - S/P rotator cuff repair  THERAPY DIAG:  Acute pain of left shoulder  Muscle weakness  (generalized)  Localized edema  Rationale for Evaluation and Treatment: Rehabilitation  ONSET DATE: Lt RTC repair and biceps tenodesis 04/06/23  SUBJECTIVE:                                                                                                                                                                                      SUBJECTIVE STATEMENT: He relays pain is not bad, ache now and then.   Hand dominance: Right  PERTINENT HISTORY: PROM initially and No AROM or biceps resistance until 05/18/23 [id number] RTC repair, COPD, bilat TKA PAIN:  Are you having pain? Yes: NPRS scale: 3/10 Pain location: Rt shoulder Pain description: dull Aggravating factors: putting on shirt Relieving factors: getting his shoulder in right position  PRECAUTIONS: Shoulder  WEIGHT BEARING RESTRICTIONS: No  FALLS:  Has patient fallen in last 6 months? No  OCCUPATION: retired  PLOF: Independent  PATIENT GOALS:Get back to playing Golf  NEXT MD VISIT: 05/20/23  OBJECTIVE:   DIAGNOSTIC FINDINGS:    PATIENT SURVEYS:  Eval: FOTO 44% functional, goal is 63%  COGNITION: Overall cognitive status: Within functional limits for tasks assessed     SENSATION: [facility]    UPPER EXTREMITY ROM:   Passive ROM Left eval Left 05/14/23 Left 05/20/2023  Shoulder flexion 140 150 140 (supine)  Shoulder extension     Shoulder abduction 140 160   Shoulder adduction     Shoulder internal rotation 40 At 70 deg abd  40 (supine)  Shoulder external rotation 38 At 45 deg abd 66 At 70 deg abd 70 (supine)  Elbow flexion     Elbow extension     Wrist flexion     Wrist extension     Wrist ulnar deviation     Wrist radial deviation     Wrist pronation     Wrist supination     (Blank rows = not tested)  UPPER EXTREMITY MMT: Deferred at eval due to post op restrictions  MMT Right  Left   Shoulder flexion    Shoulder extension    Shoulder abduction    Shoulder adduction    Shoulder internal  rotation    Shoulder external rotation    Middle trapezius    Lower trapezius    Elbow flexion    Elbow extension    Wrist flexion    Wrist extension    Wrist ulnar deviation    Wrist radial deviation    Wrist pronation    Wrist supination    Grip strength (lbs)    (Blank rows = not tested)    TODAY'S TREATMENT:  05/26/23 Pulleys 2.5 min flexion AAROM Pulleys 2.5 min abd AAROM  Easy golf swing with wedge, 75% ROM and slow less than 25% velocity X 20 Easy chip shots less than 25% ROM and velocity or 5 yard shots with foam balls X 5 Performed HEP updates listed below with education and trial set   05/20/2023 Scapular retraction 10X 5 seconds Supine arm raises (scapular protraction) 20X 3 seconds (no weights yet) Thumb up the back (shoulder IR) stretch 10X 10 seconds   05/14/23 Rt shoulder PROM gentle to tolerance all planes X 15 min Pulleys PROM/AAROM 3 min flexion, 3 min abd Supine wand flexion AAROM 5 sec X 10 Supine wand abd AAROM 5 sec X 10 Supine wand ER AAROM 5 sec X 10 Gripping green digi grip X 20 Pendulums X 20 CW and CCW   PATIENT EDUCATION: Education details: HEP, PT plan of care Person educated: Patient Education method: Explanation, Demonstration, Verbal cues, and Handouts Education comprehension: verbalized understanding and needs further education   HOME EXERCISE PROGRAM: Access Code: [email or web address] URL: [email or web address] Date: 05/14/2023 Prepared by: Ivery Quale Access Code: [email or web address] URL: [email or web address] Date: 05/26/2023 Prepared by: Ivery Quale  Exercises - Circular Shoulder Pendulum with Table Support  - 2-3 x daily - 7 x weekly - 1 sets - 20 reps - Standing Shoulder Internal Rotation Stretch with Towel (Mirrored)  - 2 x daily - 6 x weekly - 1 sets - 10 reps - 5 sec hold - shoulder  ER stretch in doorway (Mirrored)  - 2 x daily - 6 x weekly - 1 sets - 10 reps - 5 hold - Supine Scapular Protraction in Flexion  with Dumbbells  - 2 x daily - 7 x weekly - 1 sets - 20 reps - 3 seconds hold - Standing Row with Anchored Resistance  - 2 x daily - 6 x weekly - 2 sets - 10-15 reps - Shoulder extension with resistance - Neutral  - 2 x daily - 6 x weekly - 2 sets - 10-15 reps - Shoulder External Rotation with Anchored Resistance  - 2 x daily - 6 x weekly - 2 sets - 10-15 reps - Shoulder Internal Rotation with Resistance  - 2 x daily - 6 x weekly - 2 sets - 10-15 reps - Shoulder Abduction - Thumbs Up  - 2 x daily - 6 x weekly - 2 sets - 10 reps - Standing Shoulder Flexion to 90 Degrees  - 2 x daily - 6 x weekly - 2 sets - 10 reps  ASSESSMENT:  CLINICAL IMPRESSION: He is now beyond 6 weeks post op and doing excellent thus far. He is ready to begin light strengthening so I updated his HEP to include these and we took him through all the exercises and he shows good understanding.   OBJECTIVE IMPAIRMENTS: decreased activity tolerance, decreased shoulder mobility, decreased ROM, decreased strength, impaired flexibility, impaired UE use, and pain.  ACTIVITY LIMITATIONS: reaching, lifting, carry,  cleaning, driving, sleeping  PERSONAL FACTORS:  ZOX:WRUEA RTC repair, COPD, bilat TKAalso affecting patient's functional outcome.  REHAB POTENTIAL: Good  CLINICAL DECISION MAKING: Stable/uncomplicated  EVALUATION COMPLEXITY: Low    GOALS: Short term PT Goals Target date: 06/03/2023   Pt will be I and compliant with HEP. Baseline:  Goal status: On going 05/20/2023 Pt will improve left shoulder PROM to [facility] Baseline: Goal status: On going 05/20/2023  Long term PT goals Target date:07/01/2023   Pt will improve Lt shoulder AROM to [facility] to improve functional reaching Baseline: Goal status: New Pt will improve  Lt shoulder strength to at least 4+/5 MMT to improve functional strength Baseline: Goal status: New Pt will improve FOTO to at least 63% functional to show improved function Baseline: Goal status: New Pt  will reduce pain to overall less than 3/10 with usual activity. Baseline: Goal status: New  PLAN: PT FREQUENCY: 1-2 times per week   PT DURATION: 6-8 weeks  PLANNED INTERVENTIONS (unless contraindicated): aquatic PT, Canalith repositioning, cryotherapy, Electrical stimulation, Iontophoresis with 4 mg/ml dexamethasome, Moist heat, traction, Ultrasound, gait training, Therapeutic exercise, balance training, neuromuscular re-education, patient/family education, prosthetic training, manual techniques, passive ROM, dry needling, taping, vasopnuematic device, vestibular, spinal manipulations, joint manipulations  PLAN FOR NEXT SESSION: how is HEP progressions, light strength progressions as able.   April Manson, PT, DPT 05/26/2023, 10:19 AM

## 2023-05-28 ENCOUNTER — Ambulatory Visit: Payer: PPO | Admitting: Physical Therapy

## 2023-05-28 ENCOUNTER — Encounter: Payer: Self-pay | Admitting: Physical Therapy

## 2023-05-28 DIAGNOSIS — M6281 Muscle weakness (generalized): Secondary | ICD-10-CM | POA: Diagnosis not present

## 2023-05-28 DIAGNOSIS — R600 Localized edema: Secondary | ICD-10-CM

## 2023-05-28 DIAGNOSIS — M25512 Pain in left shoulder: Secondary | ICD-10-CM | POA: Diagnosis not present

## 2023-05-28 NOTE — Therapy (Signed)
OUTPATIENT PHYSICAL THERAPY TREATMENT   Patient Name: Kevin Lozano MRN: [id number] DOB:[birth year], 77 y.o., male Today's Date: 05/28/2023  END OF SESSION:  PT End of Session - 05/28/23 1011     Visit Number 5    Number of Visits 12    Date for PT Re-Evaluation 07/01/23    Progress Note Due on Visit 10    PT Start Time 1007    PT Stop Time 1050    PT Time Calculation (min) 43 min    Activity Tolerance Patient tolerated treatment well;No increased pain    Behavior During Therapy WFL for tasks assessed/performed              Past Medical History:  Diagnosis Date   Arthritis    COPD (chronic obstructive pulmonary disease) (HCC)    Coronary artery calcification    Past Surgical History:  Procedure Laterality Date   CATARACT EXTRACTION Bilateral    HERNIA REPAIR Right 1971   inguinal   INGUINAL HERNIA REPAIR Left 08/01/2021   Procedure: OPEN LEFT INGUINAL HERNIA REPAIR WITH MESH;  Surgeon: Abigail Miyamoto, MD;  Location: [facility];  Service: General;  Laterality: Left;   ROTATOR CUFF REPAIR Right    SHOULDER ARTHROSCOPY WITH SUBACROMIAL DECOMPRESSION, ROTATOR CUFF REPAIR AND BICEP TENDON REPAIR Left 04/06/2023   Procedure: LEFT SHOULDER ARTHROSCOPY, DEBRIDEMENT, MINI OPEN BICEPS TENODESIS AND ROTATOR CUFF TEAR REPAIR;  Surgeon: Cammy Copa, MD;  Location: [facility];  Service: Orthopedics;  Laterality: Left;   TONSILLECTOMY     removed as a child   TOTAL KNEE ARTHROPLASTY Bilateral    WISDOM TOOTH EXTRACTION     Patient Active Problem List   Diagnosis Date Noted   Biceps tendonitis on left 04/19/2023   Traumatic complete tear of left rotator cuff 04/19/2023   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 He did chip some golf balls only small easy shots around the green and did good with this.  Hand dominance: Right  PERTINENT HISTORY: PROM initially and  No AROM or biceps resistance until 05/18/23 ZOX:WRUEA RTC repair, COPD, bilat TKA PAIN:  Are you having pain? Yes: NPRS scale: 3/10 Pain location: Rt shoulder Pain description: dull Aggravating factors: putting on shirt Relieving factors: getting his shoulder in right position  PRECAUTIONS: Shoulder  WEIGHT BEARING RESTRICTIONS: No  FALLS:  Has patient fallen in last 6 months? No  OCCUPATION: retired  PLOF: Independent  PATIENT GOALS:Get back to playing Golf  NEXT MD VISIT: 05/20/23  OBJECTIVE:   DIAGNOSTIC FINDINGS:    PATIENT SURVEYS:  Eval: FOTO 44% functional, goal is 63%  COGNITION: Overall cognitive status: Within functional limits for tasks assessed     SENSATION: [facility]    UPPER EXTREMITY ROM:   Passive ROM Left eval Left 05/14/23 Left 05/20/2023  Shoulder flexion 140 150 140 (supine)  Shoulder extension     Shoulder abduction 140 160   Shoulder adduction     Shoulder internal rotation 40 At 70 deg abd  40 (supine)  Shoulder external rotation 38 At 45 deg abd 66 At 70 deg abd 70 (supine)  Elbow flexion     Elbow extension     Wrist flexion     Wrist extension     Wrist ulnar deviation     Wrist radial deviation     Wrist pronation     Wrist supination     (Blank rows = not tested)  UPPER EXTREMITY MMT: Deferred at eval due to post op restrictions  MMT Right  Left   Shoulder flexion     Shoulder extension    Shoulder abduction    Shoulder adduction    Shoulder internal rotation    Shoulder external rotation    Middle trapezius    Lower trapezius    Elbow flexion    Elbow extension    Wrist flexion    Wrist extension    Wrist ulnar deviation    Wrist radial deviation    Wrist pronation    Wrist supination    Grip strength (lbs)    (Blank rows = not tested)    TODAY'S TREATMENT:  05/28/23 UBE 8 min L5, switch halfway Pulleys 2 min flexion AAROM Pulleys 2 min abd AAROM  90 deg shoulder flexion ball stabillity rolls 2# ball, X 15 each for up/down, Lt/Rt, circles CW and CCW Bicep curls 5# 2X15 Standing shoulder flexion AAROM 2# bar 2X10 Standing shoulder abduction to 90 deg 2X10 Standing shoulder flexion to 90 deg 2X10 Standing shoulder rows red X 20 Standing shoulder extensions red X 20 Standing shoulder IR and ER with towel roll X 20 each Standing diagonal chop (golf downswing simulation) with green X 20 Standing diagonal lift (golf swing simulation) green X 20 bilat Shoulder IR stretch with strap behind back 5 sec X 10 Standing shoulder ER stretch in doorway 5 sec X 10   05/26/23 Pulleys 2.5 min flexion AAROM Pulleys 2.5 min abd AAROM  Easy golf swing with wedge, 75% ROM and slow less than 25% velocity X 20 Easy chip shots less than 25% ROM and velocity or 5 yard shots with foam balls X 5 Performed HEP updates listed below with education and trial set   05/20/2023 Scapular retraction 10X 5 seconds Supine arm raises (scapular protraction) 20X 3 seconds (no weights yet) Thumb up the back (shoulder IR) stretch 10X 10 seconds   05/14/23 Rt shoulder PROM gentle to tolerance all planes X 15 min Pulleys PROM/AAROM 3 min flexion, 3 min abd Supine wand flexion AAROM  5 sec X 10 Supine wand abd AAROM 5 sec X 10 Supine wand ER AAROM 5 sec X 10 Gripping green digi grip X 20 Pendulums X 20 CW and CCW   PATIENT EDUCATION: Education details: HEP, PT plan of  care Person educated: Patient Education method: Explanation, Demonstration, Verbal cues, and Handouts Education comprehension: verbalized understanding and needs further education   HOME EXERCISE PROGRAM: Access Code: [email or web address] URL: [email or web address] Date: 05/14/2023 Prepared by: Ivery Quale Access Code: [email or web address] URL: [email or web address] Date: 05/26/2023 Prepared by: Ivery Quale  Exercises - Circular Shoulder Pendulum with Table Support  - 2-3 x daily - 7 x weekly - 1 sets - 20 reps - Standing Shoulder Internal Rotation Stretch with Towel (Mirrored)  - 2 x daily - 6 x weekly - 1 sets - 10 reps - 5 sec hold - shoulder ER stretch in doorway (Mirrored)  - 2 x daily - 6 x weekly - 1 sets - 10 reps - 5 hold - Supine Scapular Protraction in Flexion with Dumbbells  - 2 x daily - 7 x weekly - 1 sets - 20 reps - 3 seconds hold - Standing Row with Anchored Resistance  - 2 x daily - 6 x weekly - 2 sets - 10-15 reps - Shoulder extension with resistance - Neutral  - 2 x daily - 6 x weekly - 2 sets - 10-15 reps - Shoulder External Rotation with Anchored Resistance  - 2 x daily - 6 x weekly - 2 sets - 10-15 reps - Shoulder Internal Rotation with Resistance  - 2 x daily - 6 x weekly - 2 sets - 10-15 reps - Shoulder Abduction - Thumbs Up  - 2 x daily - 6 x weekly - 2 sets - 10 reps - Standing Shoulder Flexion to 90 Degrees  - 2 x daily - 6 x weekly - 2 sets - 10 reps  ASSESSMENT:  CLINICAL IMPRESSION: He continues to make good progress and we are now focusing to improve his shoulder strength as tolerated now that his ROM is good.   OBJECTIVE IMPAIRMENTS: decreased activity tolerance, decreased shoulder mobility, decreased ROM, decreased strength, impaired flexibility, impaired UE use, and pain.  ACTIVITY LIMITATIONS: reaching, lifting, carry,  cleaning, driving, sleeping  PERSONAL FACTORS:  [id number] RTC repair, COPD, bilat TKAalso affecting patient's functional  outcome.  REHAB POTENTIAL: Good  CLINICAL DECISION MAKING: Stable/uncomplicated  EVALUATION COMPLEXITY: Low    GOALS: Short term PT Goals Target date: 06/03/2023   Pt will be I and compliant with HEP. Baseline:  Goal status: On going 05/20/2023 Pt will improve left shoulder PROM to [facility] Baseline: Goal status: On going 05/20/2023  Long term PT goals Target date:07/01/2023   Pt will improve Lt shoulder AROM to [facility] to improve functional reaching Baseline: Goal status: New Pt will improve  Lt shoulder strength to at least 4+/5 MMT to improve functional strength Baseline: Goal status: New Pt will improve FOTO to at least 63% functional to show improved function Baseline: Goal status: New Pt will reduce pain to overall less than 3/10 with usual activity. Baseline: Goal status: New  PLAN: PT FREQUENCY: 1-2 times per week   PT DURATION: 6-8 weeks  PLANNED INTERVENTIONS (unless contraindicated): aquatic PT, Canalith repositioning, cryotherapy, Electrical stimulation, Iontophoresis with 4 mg/ml dexamethasome, Moist heat, traction, Ultrasound, gait training, Therapeutic exercise, balance training, neuromuscular re-education, patient/family education, prosthetic training, manual techniques, passive ROM, dry needling, taping, vasopnuematic device, vestibular, spinal manipulations, joint manipulations  PLAN FOR NEXT SESSION:light strength progressions as  able.   April Manson, PT, DPT 05/28/2023, 10:14 AM

## 2023-06-02 ENCOUNTER — Ambulatory Visit: Payer: PPO | Admitting: Physical Therapy

## 2023-06-02 ENCOUNTER — Encounter: Payer: Self-pay | Admitting: Physical Therapy

## 2023-06-02 DIAGNOSIS — M25512 Pain in left shoulder: Secondary | ICD-10-CM | POA: Diagnosis not present

## 2023-06-02 DIAGNOSIS — R600 Localized edema: Secondary | ICD-10-CM

## 2023-06-02 DIAGNOSIS — M6281 Muscle weakness (generalized): Secondary | ICD-10-CM | POA: Diagnosis not present

## 2023-06-02 NOTE — Therapy (Signed)
OUTPATIENT PHYSICAL THERAPY TREATMENT   Patient Name: Kevin Lozano MRN: [id number] DOB:11/10/1946, 77 y.o., male Today's Date: 06/02/2023  END OF SESSION:  PT End of Session - 06/02/23 1020     Visit Number 6    Number of Visits 12    Date for PT Re-Evaluation 07/01/23    Progress Note Due on Visit 10    PT Start Time 1015    PT Stop Time 1055    PT Time Calculation (min) 40 min    Activity Tolerance Patient tolerated treatment well;No increased pain    Behavior During Therapy WFL for tasks assessed/performed              Past Medical History:  Diagnosis Date   Arthritis    COPD (chronic obstructive pulmonary disease) (HCC)    Coronary artery calcification    Past Surgical History:  Procedure Laterality Date   CATARACT EXTRACTION Bilateral    HERNIA REPAIR Right 1971   inguinal   INGUINAL HERNIA REPAIR Left 08/01/2021   Procedure: OPEN LEFT INGUINAL HERNIA REPAIR WITH MESH;  Surgeon: Abigail Miyamoto, MD;  Location: [facility];  Service: General;  Laterality: Left;   ROTATOR CUFF REPAIR Right    SHOULDER ARTHROSCOPY WITH SUBACROMIAL DECOMPRESSION, ROTATOR CUFF REPAIR AND BICEP TENDON REPAIR Left 04/06/2023   Procedure: LEFT SHOULDER ARTHROSCOPY, DEBRIDEMENT, MINI OPEN BICEPS TENODESIS AND ROTATOR CUFF TEAR REPAIR;  Surgeon: Cammy Copa, MD;  Location: [facility];  Service: Orthopedics;  Laterality: Left;   TONSILLECTOMY     removed as a child   TOTAL KNEE ARTHROPLASTY Bilateral    WISDOM TOOTH EXTRACTION     Patient Active Problem List   Diagnosis Date Noted   Biceps tendonitis on left 04/19/2023   Traumatic complete tear of left rotator cuff 04/19/2023   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 He did chip some golf balls only small easy shots around the green and did good with this.  Hand dominance: Right  PERTINENT HISTORY: PROM initially and  No AROM or biceps resistance until 05/18/23 ZOX:WRUEA RTC repair, COPD, bilat TKA PAIN:  Are you having pain? Yes: NPRS scale: 3/10 Pain location: Rt shoulder Pain description: dull Aggravating factors: putting on shirt Relieving factors: getting his shoulder in right position  PRECAUTIONS: Shoulder  WEIGHT BEARING RESTRICTIONS: No  FALLS:  Has patient fallen in last 6 months? No  OCCUPATION: retired  PLOF: Independent  PATIENT GOALS:Get back to playing Golf  NEXT MD VISIT: 05/20/23  OBJECTIVE:   DIAGNOSTIC FINDINGS:    PATIENT SURVEYS:  Eval: FOTO 44% functional, goal is 63%  COGNITION: Overall cognitive status: Within functional limits for tasks assessed     SENSATION: [facility]    UPPER EXTREMITY ROM:   Passive ROM Left eval Left 05/14/23 Left 05/20/2023  Shoulder flexion 140 150 140 (supine)  Shoulder extension     Shoulder abduction 140 160   Shoulder adduction     Shoulder internal rotation 40 At 70 deg abd  40 (supine)  Shoulder external rotation 38 At 45 deg abd 66 At 70 deg abd 70 (supine)  Elbow flexion     Elbow extension     Wrist flexion     Wrist extension     Wrist ulnar deviation     Wrist radial deviation     Wrist pronation     Wrist supination     (Blank rows = not tested)  UPPER EXTREMITY MMT: Deferred at eval due to post op restrictions  MMT Right  Left   Shoulder flexion     Shoulder extension    Shoulder abduction    Shoulder adduction    Shoulder internal rotation    Shoulder external rotation    Middle trapezius    Lower trapezius    Elbow flexion    Elbow extension    Wrist flexion    Wrist extension    Wrist ulnar deviation    Wrist radial deviation    Wrist pronation    Wrist supination    Grip strength (lbs)    (Blank rows = not tested)    TODAY'S TREATMENT:  06/02/23 Pulleys 2 min flexion AAROM Pulleys 2 min abd AAROM Row machine 25# 2X15 Lat pulldown machine 25# 2X15 Seated chest press machine 10# 2X10  Standing diagonal chop (golf downswing simulation) with cable machine 20# 2X15 Standing diagonal lift (golf swing simulation) cable machine 10# 2X15 bilat Standing shoulder IR with cable machine 5# 2X15 Standing shoulder ER with green 2X15, cable machine too difficult today for this Bicep curls 6# 2X15 bilat Standing shoulder abduction to 90 deg 2X10 1#  Standing shoulder flexion to 90 deg 2X10 1# Cold pack to left shoulder 10 min not included into treatment time.   05/28/23 UBE 8 min L5, switch halfway Pulleys 2 min flexion AAROM Pulleys 2 min abd AAROM  90 deg shoulder flexion ball stabillity rolls 2# ball, X 15 each for up/down, Lt/Rt, circles CW and CCW Bicep curls 5# 2X15 Standing shoulder flexion AAROM 2# bar 2X10 Standing shoulder abduction to 90 deg 2X10 Standing shoulder flexion to 90 deg 2X10 Standing shoulder rows red X 20 Standing shoulder extensions red X 20 Standing shoulder IR and ER with towel roll X 20 each Standing diagonal chop (golf downswing simulation) with green X 20 Standing diagonal lift (golf swing simulation) green X 20 bilat Shoulder IR stretch with strap behind back 5 sec X 10 Standing shoulder ER stretch in doorway 5 sec X 10   05/26/23 Pulleys 2.5 min  flexion AAROM Pulleys 2.5 min abd AAROM  Easy golf swing with wedge, 75% ROM and slow less than 25% velocity X 20 Easy chip shots less than 25%  ROM and velocity or 5 yard shots with foam balls X 5 Performed HEP updates listed below with education and trial set   PATIENT EDUCATION: Education details: HEP, PT plan of care Person educated: Patient Education method: Explanation, Demonstration, Verbal cues, and Handouts Education comprehension: verbalized understanding and needs further education   HOME EXERCISE PROGRAM: Access Code: [email or web address] URL: [email or web address] Date: 05/14/2023 Prepared by: Ivery Quale Access Code: [email or web address] URL: [email or web address] Date: 05/26/2023 Prepared by: Ivery Quale  Exercises - Circular Shoulder Pendulum with Table Support  - 2-3 x daily - 7 x weekly - 1 sets - 20 reps - Standing Shoulder Internal Rotation Stretch with Towel (Mirrored)  - 2 x daily - 6 x weekly - 1 sets - 10 reps - 5 sec hold - shoulder ER stretch in doorway (Mirrored)  - 2 x daily - 6 x weekly - 1 sets - 10 reps - 5 hold - Supine Scapular Protraction in Flexion with Dumbbells  - 2 x daily - 7 x weekly - 1 sets - 20 reps - 3 seconds hold - Standing Row with Anchored Resistance  - 2 x daily - 6 x weekly - 2 sets - 10-15 reps - Shoulder extension with resistance - Neutral  - 2 x daily - 6 x weekly - 2 sets - 10-15 reps - Shoulder External Rotation with Anchored Resistance  - 2 x daily - 6 x weekly - 2 sets - 10-15 reps - Shoulder Internal Rotation with Resistance  - 2 x daily - 6 x weekly - 2 sets - 10-15 reps - Shoulder Abduction - Thumbs Up  - 2 x daily - 6 x weekly - 2 sets - 10 reps - Standing Shoulder Flexion to 90 Degrees  - 2 x daily - 6 x weekly - 2 sets - 10 reps  ASSESSMENT:  CLINICAL IMPRESSION: He feels ready to start gym activity to I showed him how to perform machines for general shoulder strength at light weight and recommend he stay in 10-20 rep ranges for now. He showed good tolerance to this and we will monitor for any soreness.    OBJECTIVE IMPAIRMENTS: decreased activity  tolerance, decreased shoulder mobility, decreased ROM, decreased strength, impaired flexibility, impaired UE use, and pain.  ACTIVITY LIMITATIONS: reaching, lifting, carry,  cleaning, driving, sleeping  PERSONAL FACTORS:  [id number] RTC repair, COPD, bilat TKAalso affecting patient's functional outcome.  REHAB POTENTIAL: Good  CLINICAL DECISION MAKING: Stable/uncomplicated  EVALUATION COMPLEXITY: Low    GOALS: Short term PT Goals Target date: 06/03/2023   Pt will be I and compliant with HEP. Baseline:  Goal status: On going 05/20/2023 Pt will improve left shoulder PROM to [facility] Baseline: Goal status: On going 05/20/2023  Long term PT goals Target date:07/01/2023   Pt will improve Lt shoulder AROM to [facility] to improve functional reaching Baseline: Goal status: New Pt will improve  Lt shoulder strength to at least 4+/5 MMT to improve functional strength Baseline: Goal status: New Pt will improve FOTO to at least 63% functional to show improved function Baseline: Goal status: New Pt will reduce pain to overall less than 3/10 with usual activity. Baseline: Goal status: New  PLAN: PT FREQUENCY: 1-2 times per week   PT DURATION: 6-8 weeks  PLANNED INTERVENTIONS (unless contraindicated): aquatic PT, Canalith repositioning, cryotherapy, Electrical stimulation, Iontophoresis with  4 mg/ml dexamethasome, Moist heat, traction, Ultrasound, gait training, Therapeutic exercise, balance training, neuromuscular re-education, patient/family education, prosthetic training, manual techniques, passive ROM, dry needling, taping, vasopnuematic device, vestibular, spinal manipulations, joint manipulations  PLAN FOR NEXT SESSION:light strength progressions as able.   April Manson, PT, DPT 06/02/2023, 10:21 AM

## 2023-06-04 ENCOUNTER — Encounter: Payer: Self-pay | Admitting: Physical Therapy

## 2023-06-04 ENCOUNTER — Ambulatory Visit: Payer: PPO | Admitting: Physical Therapy

## 2023-06-04 DIAGNOSIS — M25512 Pain in left shoulder: Secondary | ICD-10-CM

## 2023-06-04 DIAGNOSIS — R600 Localized edema: Secondary | ICD-10-CM | POA: Diagnosis not present

## 2023-06-04 DIAGNOSIS — M6281 Muscle weakness (generalized): Secondary | ICD-10-CM

## 2023-06-04 NOTE — Therapy (Signed)
OUTPATIENT PHYSICAL THERAPY TREATMENT   Patient Name: Kevin Lozano MRN: [id number] DOB:10-Jun-1946, 77 y.o., male Today's Date: 06/04/2023  END OF SESSION:  PT End of Session - 06/04/23 1018     Visit Number 7    Number of Visits 12    Date for PT Re-Evaluation 07/01/23    Progress Note Due on Visit 10    PT Start Time 1012    PT Stop Time 1100    PT Time Calculation (min) 48 min    Activity Tolerance Patient tolerated treatment well;No increased pain    Behavior During Therapy WFL for tasks assessed/performed              Past Medical History:  Diagnosis Date   Arthritis    COPD (chronic obstructive pulmonary disease) (HCC)    Coronary artery calcification    Past Surgical History:  Procedure Laterality Date   CATARACT EXTRACTION Bilateral    HERNIA REPAIR Right 1971   inguinal   INGUINAL HERNIA REPAIR Left 08/01/2021   Procedure: OPEN LEFT INGUINAL HERNIA REPAIR WITH MESH;  Surgeon: Abigail Miyamoto, MD;  Location: [facility];  Service: General;  Laterality: Left;   ROTATOR CUFF REPAIR Right    SHOULDER ARTHROSCOPY WITH SUBACROMIAL DECOMPRESSION, ROTATOR CUFF REPAIR AND BICEP TENDON REPAIR Left 04/06/2023   Procedure: LEFT SHOULDER ARTHROSCOPY, DEBRIDEMENT, MINI OPEN BICEPS TENODESIS AND ROTATOR CUFF TEAR REPAIR;  Surgeon: Cammy Copa, MD;  Location: [facility];  Service: Orthopedics;  Laterality: Left;   TONSILLECTOMY     removed as a child   TOTAL KNEE ARTHROPLASTY Bilateral    WISDOM TOOTH EXTRACTION     Patient Active Problem List   Diagnosis Date Noted   Biceps tendonitis on left 04/19/2023   Traumatic complete tear of left rotator cuff 04/19/2023   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 04/19/2023    PCP: Johny Blamer, MD   REFERRING PROVIDER: Cammy Copa, MD  REFERRING DIAG: [id number] (ICD-10-CM) - S/P rotator cuff repair  THERAPY DIAG:  Acute pain of left shoulder  Muscle weakness  (generalized)  Localized edema  Rationale for Evaluation and Treatment: Rehabilitation  ONSET DATE: Lt RTC repair and biceps tenodesis 04/06/23  SUBJECTIVE:                                                                                                                                                                                      SUBJECTIVE STATEMENT: He relays some soreness but not bad, it is mostly one spot around lateral left shoulder.  Hand dominance: Right  PERTINENT HISTORY: PROM initially and No AROM or biceps resistance until 05/18/23 HQI:ONGEX RTC  repair, COPD, bilat TKA PAIN:  Are you having pain? Yes: NPRS scale: 3/10 Pain location: Rt shoulder Pain description: dull Aggravating factors: putting on shirt Relieving factors: getting his shoulder in right position  PRECAUTIONS: Shoulder  WEIGHT BEARING RESTRICTIONS: No  FALLS:  Has patient fallen in last 6 months? No  OCCUPATION: retired  PLOF: Independent  PATIENT GOALS:Get back to playing Golf  NEXT MD VISIT: 05/20/23  OBJECTIVE:   DIAGNOSTIC FINDINGS:    PATIENT SURVEYS:  Eval: FOTO 44% functional, goal is 63%  COGNITION: Overall cognitive status: Within functional limits for tasks assessed     SENSATION: [facility]    UPPER EXTREMITY ROM:   Passive ROM Left eval Left 05/14/23 Left 05/20/2023  Shoulder flexion 140 150 140 (supine)  Shoulder extension     Shoulder abduction 140 160   Shoulder adduction     Shoulder internal rotation 40 At 70 deg abd  40 (supine)  Shoulder external rotation 38 At 45 deg abd 66 At 70 deg abd 70 (supine)  Elbow flexion     Elbow extension     Wrist flexion     Wrist extension     Wrist ulnar deviation     Wrist radial deviation     Wrist pronation     Wrist supination     (Blank rows = not tested)  UPPER EXTREMITY MMT: Deferred at eval due to post op restrictions  MMT Left 06/04/23    Shoulder flexion 4   Shoulder extension    Shoulder abduction 4    Shoulder adduction    Shoulder internal rotation 4   Shoulder external rotation 4   Middle trapezius    Lower trapezius    Elbow flexion    Elbow extension    Wrist flexion    Wrist extension    Wrist ulnar deviation    Wrist radial deviation    Wrist pronation    Wrist supination    Grip strength (lbs)    (Blank rows = not tested)    TODAY'S TREATMENT:  06/04/23 UBE L7 X 3 min fwd, 3 min abd UE ranger 2# X 10 flexion, abd, circles Row machine 25# 2X15 Lat pulldown machine 25# 2X15 Seated chest press machine 10# 2X15 Bicep curl on cable machine 20# bilat 2X15 Tricep extension on cable machine 25# 2X15 Standing diagonal chop (golf downswing simulation) with cable machine 25# 2X15 Standing diagonal lift (golf swing simulation) cable machine 15# 2X15 bilat Standing shoulder IR with cable machine 5# 2X15 Standing shoulder ER with green 3X10, cable machine too difficult today for this Standing shoulder abduction to 90 deg 2X10 1#  Standing shoulder flexion to 90 deg 2X10 1# Cold pack to left shoulder 8 min not included into treatment time.  06/02/23 Pulleys 2 min flexion AAROM Pulleys 2 min abd AAROM Row machine 25# 2X15 Lat pulldown machine 25# 2X15 Seated chest press machine 10# 2X10  Standing diagonal chop (golf downswing simulation) with cable machine 20# 2X15 Standing diagonal lift (golf swing simulation) cable machine 10# 2X15 bilat Standing shoulder IR with cable machine 5# 2X15 Standing shoulder ER with green 2X15, cable machine too difficult today for this Bicep curls 6# 2X15 bilat Standing shoulder abduction to 90 deg 2X10 1#  Standing shoulder flexion to 90 deg 2X10 1# Cold pack to left shoulder 10 min not included into treatment time.     PATIENT EDUCATION: Education details: HEP, PT plan of care Person educated: Patient Education method: Explanation, Demonstration, Verbal cues,  and Handouts Education comprehension: verbalized understanding and needs  further education   HOME EXERCISE PROGRAM: Access Code: [email or web address] URL: [email or web address] Date: 05/14/2023 Prepared by: Ivery Quale Access Code: [email or web address] URL: [email or web address] Date: 05/26/2023 Prepared by: Ivery Quale  Exercises - Circular Shoulder Pendulum with Table Support  - 2-3 x daily - 7 x weekly - 1 sets - 20 reps - Standing Shoulder Internal Rotation Stretch with Towel (Mirrored)  - 2 x daily - 6 x weekly - 1 sets - 10 reps - 5 sec hold - shoulder ER stretch in doorway (Mirrored)  - 2 x daily - 6 x weekly - 1 sets - 10 reps - 5 hold - Supine Scapular Protraction in Flexion with Dumbbells  - 2 x daily - 7 x weekly - 1 sets - 20 reps - 3 seconds hold - Standing Row with Anchored Resistance  - 2 x daily - 6 x weekly - 2 sets - 10-15 reps - Shoulder extension with resistance - Neutral  - 2 x daily - 6 x weekly - 2 sets - 10-15 reps - Shoulder External Rotation with Anchored Resistance  - 2 x daily - 6 x weekly - 2 sets - 10-15 reps - Shoulder Internal Rotation with Resistance  - 2 x daily - 6 x weekly - 2 sets - 10-15 reps - Shoulder Abduction - Thumbs Up  - 2 x daily - 6 x weekly - 2 sets - 10 reps - Standing Shoulder Flexion to 90 Degrees  - 2 x daily - 6 x weekly - 2 sets - 10 reps  ASSESSMENT:  CLINICAL IMPRESSION: He did not have too much soreness after starting light gym activity. This was reviewed and continued today as he can now begin some of this outside of PT to augment his progress. His strength is improving as expected but he will continue to need more work to improve function.   OBJECTIVE IMPAIRMENTS: decreased activity tolerance, decreased shoulder mobility, decreased ROM, decreased strength, impaired flexibility, impaired UE use, and pain.  ACTIVITY LIMITATIONS: reaching, lifting, carry,  cleaning, driving, sleeping  PERSONAL FACTORS:  ZOX:WRUEA RTC repair, COPD, bilat TKAalso affecting patient's functional outcome.  REHAB  POTENTIAL: Good  CLINICAL DECISION MAKING: Stable/uncomplicated  EVALUATION COMPLEXITY: Low    GOALS: Short term PT Goals Target date: 06/03/2023   Pt will be I and compliant with HEP. Baseline:  Goal status: On going 05/20/2023 Pt will improve left shoulder PROM to [facility] Baseline: Goal status: On going 05/20/2023  Long term PT goals Target date:07/01/2023   Pt will improve Lt shoulder AROM to [facility] to improve functional reaching Baseline: Goal status: New Pt will improve  Lt shoulder strength to at least 4+/5 MMT to improve functional strength Baseline: Goal status: New Pt will improve FOTO to at least 63% functional to show improved function Baseline: Goal status: New Pt will reduce pain to overall less than 3/10 with usual activity. Baseline: Goal status: New  PLAN: PT FREQUENCY: 1-2 times per week   PT DURATION: 6-8 weeks  PLANNED INTERVENTIONS (unless contraindicated): aquatic PT, Canalith repositioning, cryotherapy, Electrical stimulation, Iontophoresis with 4 mg/ml dexamethasome, Moist heat, traction, Ultrasound, gait training, Therapeutic exercise, balance training, neuromuscular re-education, patient/family education, prosthetic training, manual techniques, passive ROM, dry needling, taping, vasopnuematic device, vestibular, spinal manipulations, joint manipulations  PLAN FOR NEXT SESSION:light strength progressions as able.   April Manson, PT, DPT 06/04/2023, 10:59 AM

## 2023-06-09 ENCOUNTER — Encounter: Payer: Self-pay | Admitting: Physical Therapy

## 2023-06-09 ENCOUNTER — Ambulatory Visit: Payer: PPO | Admitting: Physical Therapy

## 2023-06-09 DIAGNOSIS — R600 Localized edema: Secondary | ICD-10-CM

## 2023-06-09 DIAGNOSIS — M25512 Pain in left shoulder: Secondary | ICD-10-CM

## 2023-06-09 DIAGNOSIS — M6281 Muscle weakness (generalized): Secondary | ICD-10-CM

## 2023-06-09 NOTE — Therapy (Signed)
OUTPATIENT PHYSICAL THERAPY TREATMENT   Patient Name: Kevin Lozano MRN: [id number] DOB:06/30/1946, 77 y.o., male Today's Date: 06/09/2023  END OF SESSION:  PT End of Session - 06/09/23 1002     Visit Number 8    Number of Visits 12    Date for PT Re-Evaluation 07/01/23    Progress Note Due on Visit 10    PT Start Time 1010    PT Stop Time 1050    PT Time Calculation (min) 40 min    Activity Tolerance Patient tolerated treatment well;No increased pain    Behavior During Therapy WFL for tasks assessed/performed              Past Medical History:  Diagnosis Date   Arthritis    COPD (chronic obstructive pulmonary disease) (HCC)    Coronary artery calcification    Past Surgical History:  Procedure Laterality Date   CATARACT EXTRACTION Bilateral    HERNIA REPAIR Right 1971   inguinal   INGUINAL HERNIA REPAIR Left 08/01/2021   Procedure: OPEN LEFT INGUINAL HERNIA REPAIR WITH MESH;  Surgeon: Abigail Miyamoto, MD;  Location: [facility];  Service: General;  Laterality: Left;   ROTATOR CUFF REPAIR Right    SHOULDER ARTHROSCOPY WITH SUBACROMIAL DECOMPRESSION, ROTATOR CUFF REPAIR AND BICEP TENDON REPAIR Left 04/06/2023   Procedure: LEFT SHOULDER ARTHROSCOPY, DEBRIDEMENT, MINI OPEN BICEPS TENODESIS AND ROTATOR CUFF TEAR REPAIR;  Surgeon: Cammy Copa, MD;  Location: [facility];  Service: Orthopedics;  Laterality: Left;   TONSILLECTOMY     removed as a child   TOTAL KNEE ARTHROPLASTY Bilateral    WISDOM TOOTH EXTRACTION     Patient Active Problem List   Diagnosis Date Noted   Biceps tendonitis on left 04/19/2023   Traumatic complete tear of left rotator cuff 04/19/2023   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 04/19/2023    PCP: Johny Blamer, MD   REFERRING PROVIDER: Cammy Copa, MD  REFERRING DIAG: [phone] (ICD-10-CM) - S/P rotator cuff repair  THERAPY DIAG:  Acute pain of left shoulder  Muscle weakness  (generalized)  Localized edema  Rationale for Evaluation and Treatment: Rehabilitation  ONSET DATE: Lt RTC repair and biceps tenodesis 04/06/23  SUBJECTIVE:                                                                                                                                                                                      SUBJECTIVE STATEMENT: He relays he hurt his shoulder at the gym, he went to use the lat pull down machine and he thought he had light weight at 30# but he made a mistake and it was  in kilograms and was too heavy for him. Both shoulders ache now and are sore.   PERTINENT HISTORY: PROM initially and No AROM or biceps resistance until 05/18/23 [id number] RTC repair, COPD, bilat TKA PAIN:  Are you having pain? Yes: NPRS scale: 6/10 Pain location: Rt shoulder Pain description: dull Aggravating factors: putting on shirt Relieving factors: getting his shoulder in right position  PRECAUTIONS: Shoulder  WEIGHT BEARING RESTRICTIONS: No  FALLS:  Has patient fallen in last 6 months? No  OCCUPATION: retired  PLOF: Independent  PATIENT GOALS:Get back to playing Golf  NEXT MD VISIT: 05/20/23  OBJECTIVE:   DIAGNOSTIC FINDINGS:    PATIENT SURVEYS:  Eval: FOTO 44% functional, goal is 63%  COGNITION: Overall cognitive status: Within functional limits for tasks assessed     SENSATION: [facility]    UPPER EXTREMITY ROM:   Passive ROM Left eval Left 05/14/23 Left 05/20/2023  Shoulder flexion 140 150 140 (supine)  Shoulder extension     Shoulder abduction 140 160   Shoulder adduction     Shoulder internal rotation 40 At 70 deg abd  40 (supine)  Shoulder external rotation 38 At 45 deg abd 66 At 70 deg abd 70 (supine)  Elbow flexion     Elbow extension     Wrist flexion     Wrist extension     Wrist ulnar deviation     Wrist radial deviation     Wrist pronation     Wrist supination     (Blank rows = not tested)  UPPER EXTREMITY MMT: Deferred at  eval due to post op restrictions  MMT Left 06/04/23 Left 06/09/23   Shoulder flexion 4 4  Shoulder extension    Shoulder abduction 4 4  Shoulder adduction    Shoulder internal rotation 4 4  Shoulder external rotation 4 4  Middle trapezius    Lower trapezius    Elbow flexion    Elbow extension    Wrist flexion    Wrist extension    Wrist ulnar deviation    Wrist radial deviation    Wrist pronation    Wrist supination    Grip strength (lbs)    (Blank rows = not tested)    TODAY'S TREATMENT:  06/09/23 UBE 5  X 4 min fwd, 4 min abd Pulleys 2 min flexion, 2 min abd Bicep curl on cable machine 20# bilat 2X15 Tricep extension on cable machine 25# 2X15 Standing diagonal chop (golf downswing simulation) with cable machine 20# 2X15 Standing diagonal lift (golf swing simulation) cable machine 20# 2X15 bilat Shoulder rows green 2X15 Shoulder extensions green 2X15 Standing shoulder IR with green 2X15 Standing shoulder ER with green 3X10 Standing shoulder abduction to 90 deg 2X10 1#  Standing shoulder flexion to 90 deg 2X10 1# Cold pack to left shoulder 10 min not included into treatment time.  06/04/23 UBE L7 X 3 min fwd, 3 min abd UE ranger 2# X 10 flexion, abd, circles Row machine 25# 2X15 Lat pulldown machine 25# 2X15 Seated chest press machine 10# 2X15 Bicep curl on cable machine 20# bilat 2X15 Tricep extension on cable machine 25# 2X15 Standing diagonal chop (golf downswing simulation) with cable machine 25# 2X15 Standing diagonal lift (golf swing simulation) cable machine 15# 2X15 bilat Standing shoulder IR with cable machine 5# 2X15 Standing shoulder ER with green 3X10, cable machine too difficult today for this Standing shoulder abduction to 90 deg 2X10 1#  Standing shoulder flexion to 90 deg 2X10 1# Cold pack  to left shoulder 8 min not included into treatment time.  06/02/23 Pulleys 2 min flexion AAROM Pulleys 2 min abd AAROM Row machine 25# 2X15 Lat pulldown  machine 25# 2X15 Seated chest press machine 10# 2X10  Standing diagonal chop (golf downswing simulation) with cable machine 20# 2X15 Standing diagonal lift (golf swing simulation) cable machine 10# 2X15 bilat Standing shoulder IR with cable machine 5# 2X15 Standing shoulder ER with green 2X15, cable machine too difficult today for this Bicep curls 6# 2X15 bilat Standing shoulder abduction to 90 deg 2X10 1#  Standing shoulder flexion to 90 deg 2X10 1# Cold pack to left shoulder 10 min not included into treatment time.     PATIENT EDUCATION: Education details: HEP, PT plan of care Person educated: Patient Education method: Explanation, Demonstration, Verbal cues, and Handouts Education comprehension: verbalized understanding and needs further education   HOME EXERCISE PROGRAM: Access Code: [email or web address] URL: [email or web address] Date: 05/14/2023 Prepared by: Ivery Quale Access Code: [email or web address] URL: [email or web address] Date: 05/26/2023 Prepared by: Ivery Quale  Exercises - Circular Shoulder Pendulum with Table Support  - 2-3 x daily - 7 x weekly - 1 sets - 20 reps - Standing Shoulder Internal Rotation Stretch with Towel (Mirrored)  - 2 x daily - 6 x weekly - 1 sets - 10 reps - 5 sec hold - shoulder ER stretch in doorway (Mirrored)  - 2 x daily - 6 x weekly - 1 sets - 10 reps - 5 hold - Supine Scapular Protraction in Flexion with Dumbbells  - 2 x daily - 7 x weekly - 1 sets - 20 reps - 3 seconds hold - Standing Row with Anchored Resistance  - 2 x daily - 6 x weekly - 2 sets - 10-15 reps - Shoulder extension with resistance - Neutral  - 2 x daily - 6 x weekly - 2 sets - 10-15 reps - Shoulder External Rotation with Anchored Resistance  - 2 x daily - 6 x weekly - 2 sets - 10-15 reps - Shoulder Internal Rotation with Resistance  - 2 x daily - 6 x weekly - 2 sets - 10-15 reps - Shoulder Abduction - Thumbs Up  - 2 x daily - 6 x weekly - 2 sets - 10 reps -  Standing Shoulder Flexion to 90 Degrees  - 2 x daily - 6 x weekly - 2 sets - 10 reps  ASSESSMENT:  CLINICAL IMPRESSION: He had setback this week as he likely strained his shoulder lifting too heavy at the gym as he thought he was doing 30# but it was actually 30 Kg. He still has intact strength all planes so I do not suspect any tear at this time and is most likely a muscle strain. I did back down his exercises today and he also had good overall tolerance to program so again would not expect any tear or significant injury at this time. I do recommend he take it easy for the next week or 2 before returning to his current gym program.   OBJECTIVE IMPAIRMENTS: decreased activity tolerance, decreased shoulder mobility, decreased ROM, decreased strength, impaired flexibility, impaired UE use, and pain.  ACTIVITY LIMITATIONS: reaching, lifting, carry,  cleaning, driving, sleeping  PERSONAL FACTORS:  ZOX:WRUEA RTC repair, COPD, bilat TKAalso affecting patient's functional outcome.  REHAB POTENTIAL: Good  CLINICAL DECISION MAKING: Stable/uncomplicated  EVALUATION COMPLEXITY: Low    GOALS: Short term PT Goals Target date: 06/03/2023   Pt will be I and compliant with HEP. Baseline:  Goal status:  On going 05/20/2023 Pt will improve left shoulder PROM to [facility] Baseline: Goal status: On going 05/20/2023  Long term PT goals Target date:07/01/2023   Pt will improve Lt shoulder AROM to [facility] to improve functional reaching Baseline: Goal status: New Pt will improve  Lt shoulder strength to at least 4+/5 MMT to improve functional strength Baseline: Goal status: New Pt will improve FOTO to at least 63% functional to show improved function Baseline: Goal status: New Pt will reduce pain to overall less than 3/10 with usual activity. Baseline: Goal status: New  PLAN: PT FREQUENCY: 1-2 times per week   PT DURATION: 6-8 weeks  PLANNED INTERVENTIONS (unless contraindicated): aquatic PT, Canalith  repositioning, cryotherapy, Electrical stimulation, Iontophoresis with 4 mg/ml dexamethasome, Moist heat, traction, Ultrasound, gait training, Therapeutic exercise, balance training, neuromuscular re-education, patient/family education, prosthetic training, manual techniques, passive ROM, dry needling, taping, vasopnuematic device, vestibular, spinal manipulations, joint manipulations  PLAN FOR NEXT SESSION:light strength progressions as able.   April Manson, PT, DPT 06/09/2023, 10:04 AM

## 2023-06-11 ENCOUNTER — Encounter: Payer: Self-pay | Admitting: Physical Therapy

## 2023-06-11 ENCOUNTER — Ambulatory Visit: Payer: PPO | Admitting: Physical Therapy

## 2023-06-11 DIAGNOSIS — M25512 Pain in left shoulder: Secondary | ICD-10-CM | POA: Diagnosis not present

## 2023-06-11 DIAGNOSIS — R600 Localized edema: Secondary | ICD-10-CM | POA: Diagnosis not present

## 2023-06-11 DIAGNOSIS — M6281 Muscle weakness (generalized): Secondary | ICD-10-CM | POA: Diagnosis not present

## 2023-06-11 NOTE — Therapy (Signed)
OUTPATIENT PHYSICAL THERAPY TREATMENT   Patient Name: Kevin Lozano MRN: [id number] DOB:[birth year], 77 y.o., male Today's Date: 06/11/2023  END OF SESSION:  PT End of Session - 06/11/23 0958     Visit Number 9    Number of Visits 12    Date for PT Re-Evaluation 07/01/23    Progress Note Due on Visit 10    PT Start Time 1010    PT Stop Time 1055    PT Time Calculation (min) 45 min    Activity Tolerance Patient tolerated treatment well;No increased pain    Behavior During Therapy WFL for tasks assessed/performed              Past Medical History:  Diagnosis Date   Arthritis    COPD (chronic obstructive pulmonary disease) (HCC)    Coronary artery calcification    Past Surgical History:  Procedure Laterality Date   CATARACT EXTRACTION Bilateral    HERNIA REPAIR Right 1971   inguinal   INGUINAL HERNIA REPAIR Left 08/01/2021   Procedure: OPEN LEFT INGUINAL HERNIA REPAIR WITH MESH;  Surgeon: Abigail Miyamoto, MD;  Location: [facility];  Service: General;  Laterality: Left;   ROTATOR CUFF REPAIR Right    SHOULDER ARTHROSCOPY WITH SUBACROMIAL DECOMPRESSION, ROTATOR CUFF REPAIR AND BICEP TENDON REPAIR Left 04/06/2023   Procedure: LEFT SHOULDER ARTHROSCOPY, DEBRIDEMENT, MINI OPEN BICEPS TENODESIS AND ROTATOR CUFF TEAR REPAIR;  Surgeon: Cammy Copa, MD;  Location: [facility];  Service: Orthopedics;  Laterality: Left;   TONSILLECTOMY     removed as a child   TOTAL KNEE ARTHROPLASTY Bilateral    WISDOM TOOTH EXTRACTION     Patient Active Problem List   Diagnosis Date Noted   Biceps tendonitis on left 04/19/2023   Traumatic complete tear of left rotator cuff 04/19/2023   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 04/19/2023    PCP: Johny Blamer, MD   REFERRING PROVIDER: Cammy Copa, MD  REFERRING DIAG: [phone] (ICD-10-CM) - S/P rotator cuff repair  THERAPY DIAG:  Acute pain of left shoulder  Muscle weakness  (generalized)  Localized edema  Rationale for Evaluation and Treatment: Rehabilitation  ONSET DATE: Lt RTC repair and biceps tenodesis 04/06/23  SUBJECTIVE:                                                                                                                                                                                      SUBJECTIVE STATEMENT: He relays his shoulder is improving some overall. Not as sore or as much pain as last time.   PERTINENT HISTORY: PROM initially and No AROM or biceps resistance until 05/18/23 BMW:[id number] RTC repair,  COPD, bilat TKA PAIN:  Are you having pain? Yes: NPRS scale: 4/10 Pain location: Rt shoulder Pain description: dull Aggravating factors: putting on shirt Relieving factors: getting his shoulder in right position  PRECAUTIONS: Shoulder  WEIGHT BEARING RESTRICTIONS: No  FALLS:  Has patient fallen in last 6 months? No  OCCUPATION: retired  PLOF: Independent  PATIENT GOALS:Get back to playing Golf  NEXT MD VISIT: 05/20/23  OBJECTIVE:   DIAGNOSTIC FINDINGS:    PATIENT SURVEYS:  Eval: FOTO 44% functional, goal is 63%  COGNITION: Overall cognitive status: Within functional limits for tasks assessed     SENSATION: [facility]    UPPER EXTREMITY ROM:   Passive ROM Left eval Left 05/14/23 Left 05/20/2023  Shoulder flexion 140 150 140 (supine)  Shoulder extension     Shoulder abduction 140 160   Shoulder adduction     Shoulder internal rotation 40 At 70 deg abd  40 (supine)  Shoulder external rotation 38 At 45 deg abd 66 At 70 deg abd 70 (supine)  Elbow flexion     Elbow extension     Wrist flexion     Wrist extension     Wrist ulnar deviation     Wrist radial deviation     Wrist pronation     Wrist supination     (Blank rows = not tested)  UPPER EXTREMITY MMT: Deferred at eval due to post op restrictions  MMT Left 06/04/23 Left 06/09/23   Shoulder flexion 4 4  Shoulder extension    Shoulder abduction 4 4   Shoulder adduction    Shoulder internal rotation 4 4  Shoulder external rotation 4 4  Middle trapezius    Lower trapezius    Elbow flexion    Elbow extension    Wrist flexion    Wrist extension    Wrist ulnar deviation    Wrist radial deviation    Wrist pronation    Wrist supination    Grip strength (lbs)    (Blank rows = not tested)    TODAY'S TREATMENT:  06/11/23 UBE L7 X 4 min fwd, 4 min abd Pulleys 2 min flexion, 2 min abd Row machine 25# 2X15 Lat pulldown machine 20# 2X15 Seated chest press machine 15# 2X15 Bicep curl on cable machine 25# bilat 2X15 Tricep extension on cable machine 20# 2X15 Standing diagonal chop (golf downswing simulation) with cable machine 20# 2X15 Standing diagonal lift (golf swing simulation) cable machine 20# 2X15 bilat Standing shoulder IR with green 2X15 Standing shoulder ER with green 3X10 Standing shoulder abduction to 90 deg 2X10 2#  Standing shoulder flexion to 90 deg 2X10 2# Cold pack to left shoulder 10 min not included into treatment time.   PATIENT EDUCATION: Education details: HEP, PT plan of care Person educated: Patient Education method: Explanation, Demonstration, Verbal cues, and Handouts Education comprehension: verbalized understanding and needs further education   HOME EXERCISE PROGRAM: Access Code: [email or web address] URL: [email or web address] Date: 05/14/2023 Prepared by: Ivery Quale Access Code: [email or web address] URL: [email or web address] Date: 05/26/2023 Prepared by: Ivery Quale  Exercises - Circular Shoulder Pendulum with Table Support  - 2-3 x daily - 7 x weekly - 1 sets - 20 reps - Standing Shoulder Internal Rotation Stretch with Towel (Mirrored)  - 2 x daily - 6 x weekly - 1 sets - 10 reps - 5 sec hold - shoulder ER stretch in doorway (Mirrored)  - 2 x daily - 6 x weekly - 1 sets - 10 reps - 5 hold - Supine Scapular  Protraction in Flexion with Dumbbells  - 2 x daily - 7 x weekly - 1 sets - 20  reps - 3 seconds hold - Standing Row with Anchored Resistance  - 2 x daily - 6 x weekly - 2 sets - 10-15 reps - Shoulder extension with resistance - Neutral  - 2 x daily - 6 x weekly - 2 sets - 10-15 reps - Shoulder External Rotation with Anchored Resistance  - 2 x daily - 6 x weekly - 2 sets - 10-15 reps - Shoulder Internal Rotation with Resistance  - 2 x daily - 6 x weekly - 2 sets - 10-15 reps - Shoulder Abduction - Thumbs Up  - 2 x daily - 6 x weekly - 2 sets - 10 reps - Standing Shoulder Flexion to 90 Degrees  - 2 x daily - 6 x weekly - 2 sets - 10 reps  ASSESSMENT:  CLINICAL IMPRESSION: He had less overall pain with shoulder and was able to progress his strengthening with good tolerance. He will continue to benefit from PT.  OBJECTIVE IMPAIRMENTS: decreased activity tolerance, decreased shoulder mobility, decreased ROM, decreased strength, impaired flexibility, impaired UE use, and pain.  ACTIVITY LIMITATIONS: reaching, lifting, carry,  cleaning, driving, sleeping  PERSONAL FACTORS:  WGN:FAOZH RTC repair, COPD, bilat TKAalso affecting patient's functional outcome.  REHAB POTENTIAL: Good  CLINICAL DECISION MAKING: Stable/uncomplicated  EVALUATION COMPLEXITY: Low    GOALS: Short term PT Goals Target date: 06/03/2023   Pt will be I and compliant with HEP. Baseline:  Goal status: On going 05/20/2023 Pt will improve left shoulder PROM to [facility] Baseline: Goal status: On going 05/20/2023  Long term PT goals Target date:07/01/2023   Pt will improve Lt shoulder AROM to [facility] to improve functional reaching Baseline: Goal status: New Pt will improve  Lt shoulder strength to at least 4+/5 MMT to improve functional strength Baseline: Goal status: New Pt will improve FOTO to at least 63% functional to show improved function Baseline: Goal status: New Pt will reduce pain to overall less than 3/10 with usual activity. Baseline: Goal status: New  PLAN: PT FREQUENCY: 1-2 times per  week   PT DURATION: 6-8 weeks  PLANNED INTERVENTIONS (unless contraindicated): aquatic PT, Canalith repositioning, cryotherapy, Electrical stimulation, Iontophoresis with 4 mg/ml dexamethasome, Moist heat, traction, Ultrasound, gait training, Therapeutic exercise, balance training, neuromuscular re-education, patient/family education, prosthetic training, manual techniques, passive ROM, dry needling, taping, vasopnuematic device, vestibular, spinal manipulations, joint manipulations  PLAN FOR NEXT SESSION:light strength progressions as able.   April Manson, PT, DPT 06/11/2023, 10:03 AM

## 2023-06-17 ENCOUNTER — Encounter: Payer: Self-pay | Admitting: Physical Therapy

## 2023-06-17 ENCOUNTER — Ambulatory Visit: Payer: PPO | Admitting: Physical Therapy

## 2023-06-17 DIAGNOSIS — M6281 Muscle weakness (generalized): Secondary | ICD-10-CM | POA: Diagnosis not present

## 2023-06-17 DIAGNOSIS — R600 Localized edema: Secondary | ICD-10-CM

## 2023-06-17 DIAGNOSIS — M25512 Pain in left shoulder: Secondary | ICD-10-CM

## 2023-06-17 NOTE — Therapy (Signed)
OUTPATIENT PHYSICAL THERAPY TREATMENT Progress Note reporting period date 05/06/23 to 06/17/23  See below for objective and subjective measurements relating to patients progress with PT.    Patient Name: Kevin Lozano MRN: [id number] DOB:[birth year], 77 y.o., male Today's Date: 06/17/2023  END OF SESSION:  PT End of Session - 06/17/23 1141     Visit Number 10    Number of Visits 12    Date for PT Re-Evaluation 07/01/23    Progress Note Due on Visit 10    PT Start Time 1137    PT Stop Time 1225    PT Time Calculation (min) 48 min    Activity Tolerance Patient tolerated treatment well;No increased pain    Behavior During Therapy WFL for tasks assessed/performed              Past Medical History:  Diagnosis Date   Arthritis    COPD (chronic obstructive pulmonary disease) (HCC)    Coronary artery calcification    Past Surgical History:  Procedure Laterality Date   CATARACT EXTRACTION Bilateral    HERNIA REPAIR Right 1971   inguinal   INGUINAL HERNIA REPAIR Left 08/01/2021   Procedure: OPEN LEFT INGUINAL HERNIA REPAIR WITH MESH;  Surgeon: Abigail Miyamoto, MD;  Location: [facility];  Service: General;  Laterality: Left;   ROTATOR CUFF REPAIR Right    SHOULDER ARTHROSCOPY WITH SUBACROMIAL DECOMPRESSION, ROTATOR CUFF REPAIR AND BICEP TENDON REPAIR Left 04/06/2023   Procedure: LEFT SHOULDER ARTHROSCOPY, DEBRIDEMENT, MINI OPEN BICEPS TENODESIS AND ROTATOR CUFF TEAR REPAIR;  Surgeon: Cammy Copa, MD;  Location: [facility];  Service: Orthopedics;  Laterality: Left;   TONSILLECTOMY     removed as a child   TOTAL KNEE ARTHROPLASTY Bilateral    WISDOM TOOTH EXTRACTION     Patient Active Problem List   Diagnosis Date Noted   Biceps tendonitis on left 04/19/2023   Traumatic complete tear of left rotator cuff 04/19/2023   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 04/19/2023    PCP: Johny Blamer, MD   REFERRING PROVIDER: Cammy Copa, MD  REFERRING DIAG: [phone] (ICD-10-CM) - S/P rotator cuff repair  THERAPY DIAG:  Acute pain of left shoulder  Muscle weakness (generalized)  Localized edema  Rationale for Evaluation and Treatment: Rehabilitation  ONSET DATE: Lt RTC repair and biceps tenodesis 04/06/23  SUBJECTIVE:                                                                                                                                                                                      SUBJECTIVE STATEMENT: He relays his shoulder is sore today he has been going to  the gym 2 times per day.   PERTINENT HISTORY: PROM initially and No AROM or biceps resistance until 05/18/23 XBJ:YNWGN RTC repair, COPD, bilat TKA PAIN:  Are you having pain? Yes: NPRS scale: 4/10 Pain location: Rt shoulder Pain description: dull Aggravating factors: putting on shirt Relieving factors: getting his shoulder in right position  PRECAUTIONS: Shoulder  WEIGHT BEARING RESTRICTIONS: No  FALLS:  Has patient fallen in last 6 months? No  OCCUPATION: retired  PLOF: Independent  PATIENT GOALS:Get back to playing Golf  NEXT MD VISIT: 05/20/23  OBJECTIVE:   DIAGNOSTIC FINDINGS:    PATIENT SURVEYS:  Eval: FOTO 44% functional, goal is 63%  COGNITION: Overall cognitive status: Within functional limits for tasks assessed     SENSATION: [facility]    UPPER EXTREMITY ROM:   Passive ROM Left eval Left 05/14/23 Left 05/20/2023 Left 06/17/23  Shoulder flexion 140 150 140 (supine) PROM WFL AROM 145  Shoulder extension      Shoulder abduction 140 160  PROM WNL AROM 155  Shoulder adduction      Shoulder internal rotation 40 At 70 deg abd  40 (supine) PROM WNL AROM WFL  Shoulder external rotation 38 At 45 deg abd 66 At 70 deg abd 70 (supine) PROM WNL AROM 80  Elbow flexion      Elbow extension      Wrist flexion      Wrist extension      Wrist ulnar deviation      Wrist radial deviation      Wrist pronation       Wrist supination      (Blank rows = not tested)  UPPER EXTREMITY MMT: Deferred at eval due to post op restrictions  MMT Left 06/04/23 Left 06/09/23  Left 06/15/23  Shoulder flexion 4 4 4+  Shoulder extension     Shoulder abduction 4 4 4+  Shoulder adduction     Shoulder internal rotation 4 4 5   Shoulder external rotation 4 4 4+  Middle trapezius     Lower trapezius     Elbow flexion     Elbow extension     Wrist flexion     Wrist extension     Wrist ulnar deviation     Wrist radial deviation     Wrist pronation     Wrist supination     Grip strength (lbs)     (Blank rows = not tested)    TODAY'S TREATMENT:  06/11/23 UBE L5 X 4 min fwd, 4 min abd Pulleys 2 min flexion, 2 min abd Golf simulations swing pitching wedge 50%, 60%, 70%, 75% then driver 56% X2 Body blade 30 sec each A-P, lateral, Sup-Inf, scaption A-P Farmers/suitcase cary 20# one lap each arm. Overhead 2# ball stabillity rolls X 15 up/down, lateral, circles Standing shoulder abduction to 90 deg 2X10 2# ball Standing shoulder flexion to 90 deg 2X10 2# ball Cold pack to left shoulder 10 min not included into treatment time.   PATIENT EDUCATION: Education details: HEP, PT plan of care Person educated: Patient Education method: Explanation, Demonstration, Verbal cues, and Handouts Education comprehension: verbalized understanding and needs further education   HOME EXERCISE PROGRAM: Access Code: [email or web address] URL: [email or web address] Date: 05/14/2023 Prepared by: Ivery Quale Access Code: [email or web address] URL: [email or web address] Date: 05/26/2023 Prepared by: Ivery Quale  Exercises - Circular Shoulder Pendulum with Table Support  - 2-3 x daily - 7 x weekly - 1 sets - 20 reps - Standing Shoulder Internal Rotation Stretch with Towel (Mirrored)  -  2 x daily - 6 x weekly - 1 sets - 10 reps - 5 sec hold - shoulder ER stretch in doorway (Mirrored)  - 2 x daily - 6 x weekly - 1 sets - 10  reps - 5 hold - Supine Scapular Protraction in Flexion with Dumbbells  - 2 x daily - 7 x weekly - 1 sets - 20 reps - 3 seconds hold - Standing Row with Anchored Resistance  - 2 x daily - 6 x weekly - 2 sets - 10-15 reps - Shoulder extension with resistance - Neutral  - 2 x daily - 6 x weekly - 2 sets - 10-15 reps - Shoulder External Rotation with Anchored Resistance  - 2 x daily - 6 x weekly - 2 sets - 10-15 reps - Shoulder Internal Rotation with Resistance  - 2 x daily - 6 x weekly - 2 sets - 10-15 reps - Shoulder Abduction - Thumbs Up  - 2 x daily - 6 x weekly - 2 sets - 10 reps - Standing Shoulder Flexion to 90 Degrees  - 2 x daily - 6 x weekly - 2 sets - 10 reps  ASSESSMENT:  CLINICAL IMPRESSION: He has made good progress overall with his left shoulder strength and ROM. He has 2 more visits scheduled and will likely be ready to transition to HEP after that.   OBJECTIVE IMPAIRMENTS: decreased activity tolerance, decreased shoulder mobility, decreased ROM, decreased strength, impaired flexibility, impaired UE use, and pain.  ACTIVITY LIMITATIONS: reaching, lifting, carry,  cleaning, driving, sleeping  PERSONAL FACTORS:  NWG:NFAOZ RTC repair, COPD, bilat TKAalso affecting patient's functional outcome.  REHAB POTENTIAL: Good  CLINICAL DECISION MAKING: Stable/uncomplicated  EVALUATION COMPLEXITY: Low    GOALS: Short term PT Goals Target date: 06/03/2023   Pt will be I and compliant with HEP. Baseline:  Goal status: MET 06/15/23 Pt will improve left shoulder PROM to [facility] Baseline: Goal status: MET 06/17/23  Long term PT goals Target date:07/01/2023   Pt will improve Lt shoulder AROM to [facility] to improve functional reaching Baseline: Goal status: MET 06/17/23 Pt will improve  Lt shoulder strength to at least 4+/5 MMT to improve functional strength Baseline: Goal status:MET 06/17/23 Pt will improve FOTO to at least 63% functional to show improved function Baseline: Goal  status:ongoing  06/17/23 Pt will reduce pain to overall less than 3/10 with usual activity. Baseline: Goal status: ongoing 06/17/23  PLAN: PT FREQUENCY: 1-2 times per week   PT DURATION: 6-8 weeks  PLANNED INTERVENTIONS (unless contraindicated): aquatic PT, Canalith repositioning, cryotherapy, Electrical stimulation, Iontophoresis with 4 mg/ml dexamethasome, Moist heat, traction, Ultrasound, gait training, Therapeutic exercise, balance training, neuromuscular re-education, patient/family education, prosthetic training, manual techniques, passive ROM, dry needling, taping, vasopnuematic device, vestibular, spinal manipulations, joint manipulations  PLAN FOR NEXT SESSION:light strength progressions as able and transition to independent program over last 2 visits if able.   April Manson, PT, DPT 06/17/2023, 11:42 AM

## 2023-06-24 ENCOUNTER — Encounter: Payer: Self-pay | Admitting: Physical Therapy

## 2023-06-24 ENCOUNTER — Ambulatory Visit: Payer: PPO | Admitting: Physical Therapy

## 2023-06-24 DIAGNOSIS — M6281 Muscle weakness (generalized): Secondary | ICD-10-CM

## 2023-06-24 DIAGNOSIS — R600 Localized edema: Secondary | ICD-10-CM | POA: Diagnosis not present

## 2023-06-24 DIAGNOSIS — M25512 Pain in left shoulder: Secondary | ICD-10-CM | POA: Diagnosis not present

## 2023-06-24 NOTE — Therapy (Signed)
OUTPATIENT PHYSICAL THERAPY TREATMENT Progress Note reporting period date 05/06/23 to 06/17/23  See below for objective and subjective measurements relating to patients progress with PT.    Patient Name: Kevin Lozano MRN: [id number] DOB:01-29-1946, 77 y.o., male Today's Date: 06/24/2023  END OF SESSION:  PT End of Session - 06/24/23 0956     Visit Number 11    Number of Visits 12    Date for PT Re-Evaluation 07/01/23    Progress Note Due on Visit 10    PT Start Time 1005    PT Stop Time 1045    PT Time Calculation (min) 40 min    Activity Tolerance Patient tolerated treatment well;No increased pain    Behavior During Therapy WFL for tasks assessed/performed              Past Medical History:  Diagnosis Date   Arthritis    COPD (chronic obstructive pulmonary disease) (HCC)    Coronary artery calcification    Past Surgical History:  Procedure Laterality Date   CATARACT EXTRACTION Bilateral    HERNIA REPAIR Right 1971   inguinal   INGUINAL HERNIA REPAIR Left 08/01/2021   Procedure: OPEN LEFT INGUINAL HERNIA REPAIR WITH MESH;  Surgeon: Abigail Miyamoto, MD;  Location: [facility];  Service: General;  Laterality: Left;   ROTATOR CUFF REPAIR Right    SHOULDER ARTHROSCOPY WITH SUBACROMIAL DECOMPRESSION, ROTATOR CUFF REPAIR AND BICEP TENDON REPAIR Left 04/06/2023   Procedure: LEFT SHOULDER ARTHROSCOPY, DEBRIDEMENT, MINI OPEN BICEPS TENODESIS AND ROTATOR CUFF TEAR REPAIR;  Surgeon: Cammy Copa, MD;  Location: [facility];  Service: Orthopedics;  Laterality: Left;   TONSILLECTOMY     removed as a child   TOTAL KNEE ARTHROPLASTY Bilateral    WISDOM TOOTH EXTRACTION     Patient Active Problem List   Diagnosis Date Noted   Biceps tendonitis on left 04/19/2023   Traumatic complete tear of left rotator cuff 04/19/2023   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 04/19/2023    PCP: Johny Blamer, MD   REFERRING PROVIDER: Cammy Copa, MD  REFERRING DIAG: [phone] (ICD-10-CM) - S/P rotator cuff repair  THERAPY DIAG:  Acute pain of left shoulder  Muscle weakness (generalized)  Localized edema  Rationale for Evaluation and Treatment: Rehabilitation  ONSET DATE: Lt RTC repair and biceps tenodesis 04/06/23  SUBJECTIVE:                                                                                                                                                                                      SUBJECTIVE STATEMENT: He relays his shoulder is feeling better overall  PERTINENT HISTORY: PROM  initially and No AROM or biceps resistance until 05/18/23 RUE:AVWUJ RTC repair, COPD, bilat TKA PAIN:  Are you having pain? Yes: NPRS scale: 2/10 Pain location: Rt shoulder Pain description: dull Aggravating factors: putting on shirt Relieving factors: getting his shoulder in right position  PRECAUTIONS: Shoulder  WEIGHT BEARING RESTRICTIONS: No  FALLS:  Has patient fallen in last 6 months? No  OCCUPATION: retired  PLOF: Independent  PATIENT GOALS:Get back to playing Golf  NEXT MD VISIT: 05/20/23  OBJECTIVE:   DIAGNOSTIC FINDINGS:    PATIENT SURVEYS:  Eval: FOTO 44% functional, goal is 63%  COGNITION: Overall cognitive status: Within functional limits for tasks assessed     SENSATION: [facility]    UPPER EXTREMITY ROM:   Passive ROM Left eval Left 05/14/23 Left 05/20/2023 Left 06/17/23  Shoulder flexion 140 150 140 (supine) PROM WFL AROM 145  Shoulder extension      Shoulder abduction 140 160  PROM WNL AROM 155  Shoulder adduction      Shoulder internal rotation 40 At 70 deg abd  40 (supine) PROM WNL AROM WFL  Shoulder external rotation 38 At 45 deg abd 66 At 70 deg abd 70 (supine) PROM WNL AROM 80  Elbow flexion      Elbow extension      Wrist flexion      Wrist extension      Wrist ulnar deviation      Wrist radial deviation      Wrist pronation      Wrist supination      (Blank  rows = not tested)  UPPER EXTREMITY MMT: Deferred at eval due to post op restrictions  MMT Left 06/04/23 Left 06/09/23  Left 06/15/23  Shoulder flexion 4 4 4+  Shoulder extension     Shoulder abduction 4 4 4+  Shoulder adduction     Shoulder internal rotation 4 4 5   Shoulder external rotation 4 4 4+  Middle trapezius     Lower trapezius     Elbow flexion     Elbow extension     Wrist flexion     Wrist extension     Wrist ulnar deviation     Wrist radial deviation     Wrist pronation     Wrist supination     Grip strength (lbs)     (Blank rows = not tested)    TODAY'S TREATMENT:  06/24/23 UBE L5 X 3 min fwd, 3 min backwards Overhead 2# ball stabillity rolls X 15 up/down, lateral, circles Standing shoulder abduction to 90 deg 2X10 2# ball Standing shoulder flexion to 90 deg 2X10 2# ball Row machine 25# 2X15 Lat pulldown machine 25# 2X15 Seated chest press machine 15# 2X15 Bicep curl on cable machine 25# bilat 2X15 Tricep extension on cable machine 25# 2X15 Standing diagonal chop (golf downswing simulation) with cable machine 20# 2X15 Standing diagonal lift (golf swing simulation) cable machine 20# 2X15 bilat  PATIENT EDUCATION: Education details: HEP, PT plan of care Person educated: Patient Education method: Explanation, Demonstration, Verbal cues, and Handouts Education comprehension: verbalized understanding and needs further education   HOME EXERCISE PROGRAM: Access Code: [email or web address] URL: [email or web address] Date: 06/24/2023 Prepared by: Ivery Quale  Exercises - shoulder ER stretch in doorway (Mirrored)  - 2 x daily - 6 x weekly - 1 sets - 10 reps - 5 hold - Shoulder External Rotation with Anchored Resistance  - 2 x daily - 6 x weekly - 2 sets - 10-15 reps - Shoulder Internal Rotation  with Resistance  - 2 x daily - 6 x weekly - 2 sets - 10-15 reps - Standing Bicep Curls with Dumbbells and Rotation  - 2 x daily - 6 x weekly - 3 sets - 10 reps -  Standing Shoulder Flexion to 90 Degrees with Dumbbells  - 2 x daily - 6 x weekly - 2 sets - 10 reps - Shoulder Abduction with Dumbbells - Thumbs Up  - 2 x daily - 6 x weekly - 3 sets - 10 reps  ASSESSMENT:  CLINICAL IMPRESSION: He has made good progress overall with his left shoulder strength and ROM. He has 1 more visit scheduled and will review final HEP and plan to transition to independent program.   OBJECTIVE IMPAIRMENTS: decreased activity tolerance, decreased shoulder mobility, decreased ROM, decreased strength, impaired flexibility, impaired UE use, and pain.  ACTIVITY LIMITATIONS: reaching, lifting, carry,  cleaning, driving, sleeping  PERSONAL FACTORS:  UJW:[id number] RTC repair, COPD, bilat TKAalso affecting patient's functional outcome.  REHAB POTENTIAL: Good  CLINICAL DECISION MAKING: Stable/uncomplicated  EVALUATION COMPLEXITY: Low    GOALS: Short term PT Goals Target date: 06/03/2023   Pt will be I and compliant with HEP. Baseline:  Goal status: MET 06/15/23 Pt will improve left shoulder PROM to [facility] Baseline: Goal status: MET 06/17/23  Long term PT goals Target date:07/01/2023   Pt will improve Lt shoulder AROM to [facility] to improve functional reaching Baseline: Goal status: MET 06/17/23 Pt will improve  Lt shoulder strength to at least 4+/5 MMT to improve functional strength Baseline: Goal status:MET 06/17/23 Pt will improve FOTO to at least 63% functional to show improved function Baseline: Goal status:ongoing  06/17/23 Pt will reduce pain to overall less than 3/10 with usual activity. Baseline: Goal status: ongoing 06/17/23  PLAN: PT FREQUENCY: 1-2 times per week   PT DURATION: 6-8 weeks  PLANNED INTERVENTIONS (unless contraindicated): aquatic PT, Canalith repositioning, cryotherapy, Electrical stimulation, Iontophoresis with 4 mg/ml dexamethasome, Moist heat, traction, Ultrasound, gait training, Therapeutic exercise, balance training, neuromuscular re-education,  patient/family education, prosthetic training, manual techniques, passive ROM, dry needling, taping, vasopnuematic device, vestibular, spinal manipulations, joint manipulations  PLAN FOR NEXT SESSION:transition to independent program if he feels ready   April Manson, PT, DPT 06/24/2023, 9:56 AM

## 2023-06-29 ENCOUNTER — Encounter: Payer: Self-pay | Admitting: Physical Therapy

## 2023-06-29 ENCOUNTER — Ambulatory Visit: Payer: PPO | Admitting: Physical Therapy

## 2023-06-29 DIAGNOSIS — M6281 Muscle weakness (generalized): Secondary | ICD-10-CM

## 2023-06-29 DIAGNOSIS — R600 Localized edema: Secondary | ICD-10-CM

## 2023-06-29 DIAGNOSIS — M25512 Pain in left shoulder: Secondary | ICD-10-CM | POA: Diagnosis not present

## 2023-06-29 NOTE — Therapy (Signed)
OUTPATIENT PHYSICAL THERAPY TREATMENT PHYSICAL THERAPY DISCHARGE SUMMARY  Visits from Start of Care: 12  Current functional level related to goals / functional outcomes: See below   Remaining deficits: See below   Education / Equipment: HEP  Plan:  Patient goals were mostly met. Patient is being discharged due to reaching good functional level and is ready to transition to independent program.        Patient Name: Kevin Lozano MRN: [id number] DOB:21-Sep-1946, 77 y.o., male Today's Date: 06/29/2023  END OF SESSION:  PT End of Session - 06/29/23 1024     Visit Number 12    Number of Visits 12    Date for PT Re-Evaluation 07/01/23    Progress Note Due on Visit 10    PT Start Time 1014    PT Stop Time 1055    PT Time Calculation (min) 41 min    Activity Tolerance Patient tolerated treatment well;No increased pain    Behavior During Therapy WFL for tasks assessed/performed              Past Medical History:  Diagnosis Date   Arthritis    COPD (chronic obstructive pulmonary disease) (HCC)    Coronary artery calcification    Past Surgical History:  Procedure Laterality Date   CATARACT EXTRACTION Bilateral    HERNIA REPAIR Right 1971   inguinal   INGUINAL HERNIA REPAIR Left 08/01/2021   Procedure: OPEN LEFT INGUINAL HERNIA REPAIR WITH MESH;  Surgeon: Abigail Miyamoto, MD;  Location: [facility];  Service: General;  Laterality: Left;   ROTATOR CUFF REPAIR Right    SHOULDER ARTHROSCOPY WITH SUBACROMIAL DECOMPRESSION, ROTATOR CUFF REPAIR AND BICEP TENDON REPAIR Left 04/06/2023   Procedure: LEFT SHOULDER ARTHROSCOPY, DEBRIDEMENT, MINI OPEN BICEPS TENODESIS AND ROTATOR CUFF TEAR REPAIR;  Surgeon: Cammy Copa, MD;  Location: [facility];  Service: Orthopedics;  Laterality: Left;   TONSILLECTOMY     removed as a child   TOTAL KNEE ARTHROPLASTY Bilateral    WISDOM TOOTH EXTRACTION     Patient Active Problem List   Diagnosis Date Noted   Biceps tendonitis on left  04/19/2023   Traumatic complete tear of left rotator cuff 04/19/2023   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 04/19/2023    PCP: Johny Blamer, MD   REFERRING PROVIDER: Cammy Copa, MD  REFERRING DIAG: [phone] (ICD-10-CM) - S/P rotator cuff repair  THERAPY DIAG:  Acute pain of left shoulder  Muscle weakness (generalized)  Localized edema  Rationale for Evaluation and Treatment: Rehabilitation  ONSET DATE: Lt RTC repair and biceps tenodesis 04/06/23  SUBJECTIVE:  SUBJECTIVE STATEMENT: He relays his shoulder is overall doing good, still with some pain but he can manage it without pain meds. He agrees to DC today  PERTINENT HISTORY: PROM initially and No AROM or biceps resistance until 05/18/23 VHQ:IONGE RTC repair, COPD, bilat TKA PAIN:  Are you having pain? Yes: NPRS scale: 2/10 Pain location: Rt shoulder Pain description: dull Aggravating factors: putting on shirt Relieving factors: getting his shoulder in right position  PRECAUTIONS: Shoulder  WEIGHT BEARING RESTRICTIONS: No  FALLS:  Has patient fallen in last 6 months? No  OCCUPATION: retired  PLOF: Independent  PATIENT GOALS:Get back to playing Golf  NEXT MD VISIT: 05/20/23  OBJECTIVE:   DIAGNOSTIC FINDINGS:    PATIENT SURVEYS:  Eval: FOTO 44% functional, goal is 63% 06/29/23 :FOTO improved to 56%  COGNITION: Overall cognitive status: Within functional limits for tasks assessed     SENSATION: [facility]    UPPER EXTREMITY ROM:   Passive ROM Left eval Left 05/14/23 Left 05/20/2023 Left 06/17/23 Left 06/29/23  Shoulder flexion 140 150 140 (supine) PROM WFL AROM 145 A:WFL  Shoulder extension       Shoulder abduction 140 160  PROM WNL AROM 155 A:WFL  Shoulder adduction        Shoulder internal rotation 40 At 70 deg abd  40 (supine) PROM WNL AROM [facility] A:WFL  Shoulder external rotation 38 At 45 deg abd 66 At 70 deg abd 70 (supine) PROM WNL AROM 80 A:WFL  Elbow flexion       Elbow extension       Wrist flexion       Wrist extension       Wrist ulnar deviation       Wrist radial deviation       Wrist pronation       Wrist supination       (Blank rows = not tested)  UPPER EXTREMITY MMT: Deferred at eval due to post op restrictions  MMT Left 06/04/23 Left 06/09/23  Left 06/15/23 Left 06/29/23  Shoulder flexion 4 4 4+ 5  Shoulder extension      Shoulder abduction 4 4 4+ 5  Shoulder adduction      Shoulder internal rotation 4 4 5 5   Shoulder external rotation 4 4 4+ 5  Middle trapezius      Lower trapezius      Elbow flexion      Elbow extension      Wrist flexion      Wrist extension      Wrist ulnar deviation      Wrist radial deviation      Wrist pronation      Wrist supination      Grip strength (lbs)      (Blank rows = not tested)    TODAY'S TREATMENT:  06/29/23 UBE L5 X 3 min fwd, 3 min backwards Overhead 2# ball stabillity rolls X 15 up/down, lateral, circles Standing shoulder abduction to 90 deg 2X10 2# ball Standing shoulder flexion to 90 deg 2X10 2# ball Row machine 25# 2X15 Lat pulldown machine 25# 2X15 Seated chest press machine 15# 2X15 Bicep curl on cable machine 25# bilat 2X15 Tricep extension on cable machine 25# 2X15 Standing diagonal chop (golf downswing simulation) with cable machine 20# 2X15 Standing diagonal lift (golf swing simulation) cable machine 20# 2X15 bilat  PATIENT EDUCATION: Education details: HEP, PT plan of care Person educated: Patient Education method: Explanation, Demonstration, Verbal cues, and Handouts Education comprehension: verbalized understanding and needs  further education   HOME EXERCISE PROGRAM: Access Code: [email or web address] URL: [email or web address] Date: 06/24/2023 Prepared by:  Ivery Quale  Exercises - shoulder ER stretch in doorway (Mirrored)  - 2 x daily - 6 x weekly - 1 sets - 10 reps - 5 hold - Shoulder External Rotation with Anchored Resistance  - 2 x daily - 6 x weekly - 2 sets - 10-15 reps - Shoulder Internal Rotation with Resistance  - 2 x daily - 6 x weekly - 2 sets - 10-15 reps - Standing Bicep Curls with Dumbbells and Rotation  - 2 x daily - 6 x weekly - 3 sets - 10 reps - Standing Shoulder Flexion to 90 Degrees with Dumbbells  - 2 x daily - 6 x weekly - 2 sets - 10 reps - Shoulder Abduction with Dumbbells - Thumbs Up  - 2 x daily - 6 x weekly - 3 sets - 10 reps  ASSESSMENT:  CLINICAL IMPRESSION: He has met all but one PT goal and is at a good functional level. He is ready to transition to independent program.   OBJECTIVE IMPAIRMENTS: decreased activity tolerance, decreased shoulder mobility, decreased ROM, decreased strength, impaired flexibility, impaired UE use, and pain.  ACTIVITY LIMITATIONS: reaching, lifting, carry,  cleaning, driving, sleeping  PERSONAL FACTORS:  ZOX:WRUEA RTC repair, COPD, bilat TKAalso affecting patient's functional outcome.  REHAB POTENTIAL: Good  CLINICAL DECISION MAKING: Stable/uncomplicated  EVALUATION COMPLEXITY: Low    GOALS: Short term PT Goals Target date: 06/03/2023   Pt will be I and compliant with HEP. Baseline:  Goal status: MET 06/15/23 Pt will improve left shoulder PROM to [facility] Baseline: Goal status: MET 06/17/23  Long term PT goals Target date:07/01/2023   Pt will improve Lt shoulder AROM to [facility] to improve functional reaching Baseline: Goal status: MET 06/17/23 Pt will improve  Lt shoulder strength to at least 4+/5 MMT to improve functional strength Baseline: Goal status:MET 06/17/23 Pt will improve FOTO to at least 63% functional to show improved function Baseline: Goal status:ongoing  06/17/23 Pt will reduce pain to overall less than 3/10 with usual activity. Baseline: Goal status: Mostly  met 06/29/23  PLAN: PT FREQUENCY: 1-2 times per week   PT DURATION: 6-8 weeks  PLANNED INTERVENTIONS (unless contraindicated): aquatic PT, Canalith repositioning, cryotherapy, Electrical stimulation, Iontophoresis with 4 mg/ml dexamethasome, Moist heat, traction, Ultrasound, gait training, Therapeutic exercise, balance training, neuromuscular re-education, patient/family education, prosthetic training, manual techniques, passive ROM, dry needling, taping, vasopnuematic device, vestibular, spinal manipulations, joint manipulations  PLAN FOR NEXT SESSION:transition to independent program and DC today.    April Manson, PT, DPT 06/29/2023, 10:25 AM

## 2023-07-01 ENCOUNTER — Ambulatory Visit (INDEPENDENT_AMBULATORY_CARE_PROVIDER_SITE_OTHER): Payer: PPO | Admitting: Surgical

## 2023-07-01 ENCOUNTER — Other Ambulatory Visit: Payer: Self-pay

## 2023-07-01 DIAGNOSIS — M75122 Complete rotator cuff tear or rupture of left shoulder, not specified as traumatic: Secondary | ICD-10-CM

## 2023-07-01 DIAGNOSIS — M19012 Primary osteoarthritis, left shoulder: Secondary | ICD-10-CM

## 2023-07-01 DIAGNOSIS — Z9889 Other specified postprocedural states: Secondary | ICD-10-CM

## 2023-07-03 ENCOUNTER — Encounter: Payer: Self-pay | Admitting: Surgical

## 2023-07-03 MED ORDER — BUPIVACAINE HCL 0.25 % IJ SOLN
9.0000 mL | INTRAMUSCULAR | Status: AC | PRN
Start: 2023-07-01 — End: 2023-07-01
  Administered 2023-07-01: 9 mL via INTRA_ARTICULAR

## 2023-07-03 MED ORDER — LIDOCAINE HCL 1 % IJ SOLN
5.0000 mL | INTRAMUSCULAR | Status: AC | PRN
Start: 2023-07-01 — End: 2023-07-01
  Administered 2023-07-01: 5 mL

## 2023-07-03 NOTE — Progress Notes (Addendum)
Post-Op Visit Note   Patient: Kevin Lozano           Date of Birth: 29-Jan-1946           MRN: [id number] Visit Date: 07/01/2023 PCP: Noberto Retort, MD   Assessment & Plan:  Chief Complaint:  Chief Complaint  Patient presents with   Left Shoulder - Routine Post Op    S/p left shoulder rotator cuff tear repair 04/06/23   Visit Diagnoses:  1. Nontraumatic complete tear of left rotator cuff   2. S/P rotator cuff repair     Plan: Patient is a 77 year old male who presents s/p left shoulder rotator cuff tear repair and bicep tenodesis on 04/06/2023.  He states that he is progressing well but he does note continued lateral shoulder pain that occasionally will radiate to the elbow.  He was doing physical therapy upstairs but has finished this and is now transitioning to a home exercise program and doing some exercises at the gym.  Reports that he is able to sleep through the night but cannot sleep on that operative shoulder long-term just yet.  No mechanical symptoms in the shoulder.  On exam, patient has left shoulder with 50 degrees X rotation, 80 degrees abduction, 160 degrees forward elevation passively and actively.  This compared with the right shoulder with 60 degrees X rotation, 120 degrees abduction, 170 degrees forward elevation passively and actively.  Axillary nerve intact with deltoid firing in the left shoulder.  Supraspinatus strength rated 5/5 and subscapularis strength rated 5/5 of the left shoulder.  He has 5 -/5 infraspinatus /external rotation strength relative to 5/5 strength on the contralateral side.  Negative Hornblower sign; negative external rotation lag sign.  He has no significant crepitus noted with passive motion of the shoulder.  He has no Popeye deformity.  Incisions are well-healed.  Patient is progressing well following rotator cuff tear repair.  Has a little bit of asymmetric weakness of external rotation relative to the contralateral side but no crepitus  concerning for rotator cuff retear.  We did review his arthroscopy pictures today which demonstrate chondromalacia of the humeral head which may be responsible for his persistent shoulder pain.  After discussion of options, he would like to try glenohumeral injection to see if this will help his pain.  This will be more of a diagnostic injection with Toradol/Marcaine as he is just shy of 3 months out from rotator cuff tear repair and want to avoid cortisone still.  Under ultrasound guidance, glenohumeral injection administered with 9 cc Marcaine mixed with 1 cc Toradol.  Patient tolerated procedure well.  Follow-up with the office in 2 months if he is still symptomatic with Dr. August Saucer.    Procedure Note  Patient: Kevin Lozano             Date of Birth: Feb 21, 1946           MRN: [id number]             Visit Date: 07/01/2023  Procedures: Visit Diagnoses:  1. Nontraumatic complete tear of left rotator cuff   2. S/P rotator cuff repair     Large Joint Inj: L glenohumeral on 07/01/2023 9:55 AM Details: 22 G 3.5 in needle, ultrasound-guided posterior approach Medications: 5 mL lidocaine 1 %; 9 mL bupivacaine 0.25 % (9 cc of Marcaine mixed with 1 cc Toradol) Outcome: tolerated well, no immediate complications Procedure, treatment alternatives, risks and benefits explained, specific risks discussed. Consent was given by  the patient. Patient was prepped and draped in the usual sterile fashion.         Follow-Up Instructions: No follow-ups on file.   Orders:  Orders Placed This Encounter  Procedures   US Guided Needle Placement - No Linked Charges   No orders of the defined types were placed in this encounter.   Imaging: No results found.  PMFS History: Patient Active Problem List   Diagnosis Date Noted   Biceps tendonitis on left 04/19/2023   Traumatic complete tear of left rotator cuff 04/19/2023   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  04/19/2023   Past Medical History:  Diagnosis Date   Arthritis    COPD (chronic obstructive pulmonary disease) (HCC)    Coronary artery calcification     Family History  Problem Relation Age of Onset   Hyperlipidemia Mother    Hyperlipidemia Father     Past Surgical History:  Procedure Laterality Date   CATARACT EXTRACTION Bilateral    HERNIA REPAIR Right 1971   inguinal   INGUINAL HERNIA REPAIR Left 08/01/2021   Procedure: OPEN LEFT INGUINAL HERNIA REPAIR WITH MESH;  Surgeon: Abigail Miyamoto, MD;  Location: [facility];  Service: General;  Laterality: Left;   ROTATOR CUFF REPAIR Right    SHOULDER ARTHROSCOPY WITH SUBACROMIAL DECOMPRESSION, ROTATOR CUFF REPAIR AND BICEP TENDON REPAIR Left 04/06/2023   Procedure: LEFT SHOULDER ARTHROSCOPY, DEBRIDEMENT, MINI OPEN BICEPS TENODESIS AND ROTATOR CUFF TEAR REPAIR;  Surgeon: Cammy Copa, MD;  Location: [facility];  Service: Orthopedics;  Laterality: Left;   TONSILLECTOMY     removed as a child   TOTAL KNEE ARTHROPLASTY Bilateral    WISDOM TOOTH EXTRACTION     Social History   Occupational History   Not on file  Tobacco Use   Smoking status: Former    Current packs/day: 0.00    Types: Cigarettes    Quit date: 03/30/2023    Years since quitting: 0.2   Smokeless tobacco: Never   Tobacco comments:    half pack daily-03/20/21-AH  Vaping Use   Vaping status: Never Used  Substance and Sexual Activity   Alcohol use: Never   Drug use: Never   Sexual activity: Not on file

## 2023-09-02 ENCOUNTER — Encounter: Payer: Self-pay | Admitting: Surgical

## 2023-09-02 ENCOUNTER — Ambulatory Visit: Payer: PPO | Admitting: Surgical

## 2023-09-02 DIAGNOSIS — Z9889 Other specified postprocedural states: Secondary | ICD-10-CM | POA: Diagnosis not present

## 2023-09-06 ENCOUNTER — Encounter: Payer: Self-pay | Admitting: Surgical

## 2023-09-06 NOTE — Progress Notes (Signed)
Follow-up Office Visit Note   Patient: Kevin Lozano           Date of Birth: 03-12-1946           MRN: [id number] Visit Date: 09/02/2023 Requested by: Noberto Retort, MD [phone] [place],  [place] [address] PCP: Noberto Retort, MD  Subjective: Chief Complaint  Patient presents with   Left Shoulder - Follow-up    S/p left shoulder rotator cuff tear repair 04/06/23    HPI: Kevin Lozano is a 77 y.o. male who returns to the office for follow-up visit.    Plan at last visit was: Toradol/Marcaine glenohumeral injection into the left shoulder on 07/01/2023  Since then, patient notes good improvement of his shoulder pain by about 50% for 2 to 3 days after injection.  He has had some lingering improvement of his shoulder pain after the injection wore off.  Has finished physical therapy.  Shoulder pain does not get in the way of him sleeping on his left side now.  No significant weakness that he notices.              ROS: All systems reviewed are negative as they relate to the chief complaint within the history of present illness.  Patient denies fevers or chills.  Assessment & Plan: Visit Diagnoses:  1. S/P rotator cuff repair     Plan: Kevin Lozano is a 77 y.o. male who returns to the office for follow-up visit.  Plan from last visit was noted above in HPI.  They now return with good relief from glenohumeral injection at last visit with Toradol.  Do not recommend proceeding with cortisone injection due to the close proximity of his rotator cuff repair.  With slow but continual improvement over the last couple months of his shoulder pain, plan for patient to follow-up as needed.  Right now he is very functional and has good strength on exam.  Expect that he will have some continued improvement of the shoulder until about a year out from surgery.  Follow-Up Instructions: No follow-ups on file.   Orders:  No orders of the defined types were placed in this  encounter.  No orders of the defined types were placed in this encounter.     Procedures: No procedures performed   Clinical Data: No additional findings.  Objective: Vital Signs: There were no vitals taken for this visit.  Physical Exam:  Constitutional: Patient appears well-developed HEENT:  Head: Normocephalic Eyes:EOM are normal Neck: Normal range of motion Cardiovascular: Normal rate Pulmonary/chest: Effort normal Neurologic: Patient is alert Skin: Skin is warm Psychiatric: Patient has normal mood and affect  Ortho Exam: Ortho exam demonstrates left shoulder with 70 degrees X rotation, 90 degrees abduction, 150 degrees forward elevation passively and actively.  Excellent rotator cuff strength of supra, infra, subscap rated 5/5.  Incisions are well-healed.  Axillary nerve intact with deltoid firing.  Specialty Comments:  No specialty comments available.  Imaging: No results found.   PMFS History: Patient Active Problem List   Diagnosis Date Noted   Biceps tendonitis on left 04/19/2023   Traumatic complete tear of left rotator cuff 04/19/2023   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 04/19/2023   Past Medical History:  Diagnosis Date   Arthritis    COPD (chronic obstructive pulmonary disease) (HCC)    Coronary artery calcification     Family History  Problem Relation Age of Onset  Hyperlipidemia Mother    Hyperlipidemia Father     Past Surgical History:  Procedure Laterality Date   CATARACT EXTRACTION Bilateral    HERNIA REPAIR Right 1971   inguinal   INGUINAL HERNIA REPAIR Left 08/01/2021   Procedure: OPEN LEFT INGUINAL HERNIA REPAIR WITH MESH;  Surgeon: Abigail Miyamoto, MD;  Location: [facility];  Service: General;  Laterality: Left;   ROTATOR CUFF REPAIR Right    SHOULDER ARTHROSCOPY WITH SUBACROMIAL DECOMPRESSION, ROTATOR CUFF REPAIR AND BICEP TENDON REPAIR Left 04/06/2023   Procedure: LEFT SHOULDER  ARTHROSCOPY, DEBRIDEMENT, MINI OPEN BICEPS TENODESIS AND ROTATOR CUFF TEAR REPAIR;  Surgeon: Cammy Copa, MD;  Location: [facility];  Service: Orthopedics;  Laterality: Left;   TONSILLECTOMY     removed as a child   TOTAL KNEE ARTHROPLASTY Bilateral    WISDOM TOOTH EXTRACTION     Social History   Occupational History   Not on file  Tobacco Use   Smoking status: Former    Current packs/day: 0.00    Types: Cigarettes    Quit date: 03/30/2023    Years since quitting: 0.4   Smokeless tobacco: Never   Tobacco comments:    half pack daily-03/20/21-AH  Vaping Use   Vaping status: Never Used  Substance and Sexual Activity   Alcohol use: Never   Drug use: Never   Sexual activity: Not on file

## 2023-09-15 DIAGNOSIS — J0110 Acute frontal sinusitis, unspecified: Secondary | ICD-10-CM | POA: Diagnosis not present

## 2024-03-30 DIAGNOSIS — K219 Gastro-esophageal reflux disease without esophagitis: Secondary | ICD-10-CM | POA: Diagnosis not present

## 2024-03-30 DIAGNOSIS — M150 Primary generalized (osteo)arthritis: Secondary | ICD-10-CM | POA: Diagnosis not present

## 2024-03-30 DIAGNOSIS — Z125 Encounter for screening for malignant neoplasm of prostate: Secondary | ICD-10-CM | POA: Diagnosis not present

## 2024-03-30 DIAGNOSIS — I2584 Coronary atherosclerosis due to calcified coronary lesion: Secondary | ICD-10-CM | POA: Diagnosis not present

## 2024-03-30 DIAGNOSIS — Z122 Encounter for screening for malignant neoplasm of respiratory organs: Secondary | ICD-10-CM | POA: Diagnosis not present

## 2024-03-30 DIAGNOSIS — E7800 Pure hypercholesterolemia, unspecified: Secondary | ICD-10-CM | POA: Diagnosis not present

## 2024-03-30 DIAGNOSIS — Z0000 Encounter for general adult medical examination without abnormal findings: Secondary | ICD-10-CM | POA: Diagnosis not present

## 2024-03-30 DIAGNOSIS — F17200 Nicotine dependence, unspecified, uncomplicated: Secondary | ICD-10-CM | POA: Diagnosis not present

## 2024-04-14 ENCOUNTER — Other Ambulatory Visit: Payer: Self-pay | Admitting: Acute Care

## 2024-04-14 DIAGNOSIS — Z122 Encounter for screening for malignant neoplasm of respiratory organs: Secondary | ICD-10-CM

## 2024-04-14 DIAGNOSIS — Z87891 Personal history of nicotine dependence: Secondary | ICD-10-CM

## 2024-04-14 DIAGNOSIS — F17210 Nicotine dependence, cigarettes, uncomplicated: Secondary | ICD-10-CM

## 2024-04-29 ENCOUNTER — Ambulatory Visit
Admission: RE | Admit: 2024-04-29 | Discharge: 2024-04-29 | Disposition: A | Discharge status: Home / Self Care | Source: Ambulatory Visit | Attending: Family Medicine | Admitting: Family Medicine

## 2024-04-29 DIAGNOSIS — Z122 Encounter for screening for malignant neoplasm of respiratory organs: Secondary | ICD-10-CM

## 2024-04-29 DIAGNOSIS — Z87891 Personal history of nicotine dependence: Secondary | ICD-10-CM

## 2024-04-29 DIAGNOSIS — F17210 Nicotine dependence, cigarettes, uncomplicated: Secondary | ICD-10-CM

## 2024-05-24 ENCOUNTER — Other Ambulatory Visit: Payer: Self-pay

## 2024-05-24 DIAGNOSIS — F17210 Nicotine dependence, cigarettes, uncomplicated: Secondary | ICD-10-CM

## 2024-05-24 DIAGNOSIS — Z87891 Personal history of nicotine dependence: Secondary | ICD-10-CM

## 2024-05-24 DIAGNOSIS — Z122 Encounter for screening for malignant neoplasm of respiratory organs: Secondary | ICD-10-CM

## 2024-06-20 DIAGNOSIS — R634 Abnormal weight loss: Secondary | ICD-10-CM | POA: Diagnosis not present

## 2024-06-21 DIAGNOSIS — R634 Abnormal weight loss: Secondary | ICD-10-CM | POA: Diagnosis not present

## 2024-08-11 DIAGNOSIS — Z125 Encounter for screening for malignant neoplasm of prostate: Secondary | ICD-10-CM | POA: Diagnosis not present

## 2024-08-11 DIAGNOSIS — R3121 Asymptomatic microscopic hematuria: Secondary | ICD-10-CM | POA: Diagnosis not present

## 2024-08-12 DIAGNOSIS — R3121 Asymptomatic microscopic hematuria: Secondary | ICD-10-CM | POA: Diagnosis not present

## 2024-11-08 DIAGNOSIS — R3121 Asymptomatic microscopic hematuria: Secondary | ICD-10-CM | POA: Diagnosis not present

## 2024-11-08 DIAGNOSIS — D413 Neoplasm of uncertain behavior of urethra: Secondary | ICD-10-CM | POA: Diagnosis not present

## 2024-11-09 ENCOUNTER — Other Ambulatory Visit: Payer: Self-pay | Admitting: Urology

## 2024-11-10 ENCOUNTER — Other Ambulatory Visit: Payer: Self-pay

## 2024-11-10 ENCOUNTER — Encounter (HOSPITAL_COMMUNITY): Payer: Self-pay | Admitting: Urology

## 2024-11-10 NOTE — Progress Notes (Signed)
 For Anesthesia: PCP - Elsie JONELLE Lesches, MD  Cardiologist - Candyce Reek, MD since 2023  Bowel Prep reminder: N/A  Chest x-ray - greater than 1 year EKG - greater than 1 year Stress Test - over 20 years ago ECHO - N/A Cardiac Cath - N/A Pacemaker/ICD device last checked: N/A Pacemaker orders received: N/A Device Rep notified: N/A  Spinal Cord Stimulator: N/A  Sleep Study - N/A CPAP - N/A  Fasting Blood Sugar - N/A Checks Blood Sugar ___N/A__ times a day Date and result of last Hgb A1c-N/A  Last dose of GLP1 agonist- N/A GLP1 instructions: Hold 7 days prior to schedule (Hold 24 hours-daily)   Last dose of SGLT-2 inhibitors- N/A SGLT-2 instructions: Hold 72 hours prior to surgery  Blood Thinner Instructions: N/A Last Dose:N/A Time last taken:N/A  Aspirin  Instructions: Last Dose: 11/08/24 Time last taken: 9 PM  Activity level: Able to exercise without chest pain and/or shortness of breath    Anesthesia review: N/A  Patient denies shortness of breath, fever, cough and chest pain at PAT appointment   Patient verbalized understanding of instructions that were reviewed over the telephone.

## 2024-11-12 NOTE — Anesthesia Preprocedure Evaluation (Signed)
 Anesthesia Evaluation  Patient identified by MRN, date of birth, ID band Patient awake    Reviewed: Allergy & Precautions, NPO status , Patient's Chart, lab work & pertinent test results  History of Anesthesia Complications Negative for: history of anesthetic complications  Airway Mallampati: I  TM Distance: >3 FB Neck ROM: Full    Dental no notable dental hx. (+) Implants, Edentulous Upper   Pulmonary COPD, Current SmokerPatient did not abstain from smoking.   Pulmonary exam normal breath sounds clear to auscultation       Cardiovascular (-) hypertension(-) angina + CAD  (-) Past MI Normal cardiovascular exam Rhythm:Regular Rate:Normal     Neuro/Psych negative neurological ROS  negative psych ROS   GI/Hepatic ,neg GERD  ,,  Endo/Other  neg diabetes    Renal/GU      Musculoskeletal  (+) Arthritis ,    Abdominal   Peds  Hematology   Anesthesia Other Findings All: sulfa, Morphine  Reproductive/Obstetrics                              Anesthesia Physical Anesthesia Plan  ASA: 3  Anesthesia Plan: General   Post-op Pain Management: Ofirmev  IV (intra-op)*   Induction: Intravenous  PONV Risk Score and Plan: 2 and Treatment may vary due to age or medical condition, Ondansetron  and Dexamethasone   Airway Management Planned: LMA  Additional Equipment: None  Intra-op Plan:   Post-operative Plan: Extubation in OR  Informed Consent: I have reviewed the patients History and Physical, chart, labs and discussed the procedure including the risks, benefits and alternatives for the proposed anesthesia with the patient or authorized representative who has indicated his/her understanding and acceptance.     Dental advisory given  Plan Discussed with: CRNA and Surgeon  Anesthesia Plan Comments:          Anesthesia Quick Evaluation

## 2024-11-14 ENCOUNTER — Ambulatory Visit (HOSPITAL_COMMUNITY): Payer: Self-pay | Admitting: Anesthesiology

## 2024-11-14 ENCOUNTER — Encounter (HOSPITAL_COMMUNITY): Payer: Self-pay | Admitting: Urology

## 2024-11-14 ENCOUNTER — Encounter (HOSPITAL_COMMUNITY)
Admission: RE | Disposition: A | Discharge status: Home / Self Care | Payer: Self-pay | Source: Home / Self Care | Attending: Urology

## 2024-11-14 ENCOUNTER — Ambulatory Visit (HOSPITAL_COMMUNITY)
Admission: RE | Admit: 2024-11-14 | Discharge: 2024-11-14 | Disposition: A | Discharge status: Home / Self Care | Attending: Urology | Admitting: Urology

## 2024-11-14 DIAGNOSIS — E859 Amyloidosis, unspecified: Secondary | ICD-10-CM | POA: Diagnosis not present

## 2024-11-14 DIAGNOSIS — J449 Chronic obstructive pulmonary disease, unspecified: Secondary | ICD-10-CM | POA: Diagnosis not present

## 2024-11-14 DIAGNOSIS — I2510 Atherosclerotic heart disease of native coronary artery without angina pectoris: Secondary | ICD-10-CM | POA: Insufficient documentation

## 2024-11-14 DIAGNOSIS — N4889 Other specified disorders of penis: Secondary | ICD-10-CM | POA: Diagnosis not present

## 2024-11-14 DIAGNOSIS — N368 Other specified disorders of urethra: Secondary | ICD-10-CM | POA: Insufficient documentation

## 2024-11-14 DIAGNOSIS — E785 Hyperlipidemia, unspecified: Secondary | ICD-10-CM | POA: Diagnosis not present

## 2024-11-14 DIAGNOSIS — R3129 Other microscopic hematuria: Secondary | ICD-10-CM

## 2024-11-14 DIAGNOSIS — F17210 Nicotine dependence, cigarettes, uncomplicated: Secondary | ICD-10-CM | POA: Diagnosis not present

## 2024-11-14 DIAGNOSIS — Z96653 Presence of artificial knee joint, bilateral: Secondary | ICD-10-CM | POA: Diagnosis not present

## 2024-11-14 DIAGNOSIS — N369 Urethral disorder, unspecified: Secondary | ICD-10-CM | POA: Diagnosis not present

## 2024-11-14 DIAGNOSIS — Z83438 Family history of other disorder of lipoprotein metabolism and other lipidemia: Secondary | ICD-10-CM | POA: Diagnosis not present

## 2024-11-14 DIAGNOSIS — F17200 Nicotine dependence, unspecified, uncomplicated: Secondary | ICD-10-CM | POA: Diagnosis not present

## 2024-11-14 DIAGNOSIS — R3121 Asymptomatic microscopic hematuria: Secondary | ICD-10-CM | POA: Diagnosis present

## 2024-11-14 DIAGNOSIS — M1990 Unspecified osteoarthritis, unspecified site: Secondary | ICD-10-CM | POA: Diagnosis not present

## 2024-11-14 HISTORY — PX: CYSTOSCOPY WITH BIOPSY: SHX5122

## 2024-11-14 HISTORY — DX: Gastro-esophageal reflux disease without esophagitis: K21.9

## 2024-11-14 LAB — CBC
HCT: 44.5 % (ref 39.0–52.0)
Hemoglobin: 14.5 g/dL (ref 13.0–17.0)
MCH: 31.4 pg (ref 26.0–34.0)
MCHC: 32.6 g/dL (ref 30.0–36.0)
MCV: 96.3 fL (ref 80.0–100.0)
Platelets: 264 K/uL (ref 150–400)
RBC: 4.62 MIL/uL (ref 4.22–5.81)
RDW: 13.3 % (ref 11.5–15.5)
WBC: 9.3 K/uL (ref 4.0–10.5)
nRBC: 0 % (ref 0.0–0.2)

## 2024-11-14 SURGERY — CYSTOSCOPY, WITH BIOPSY
Anesthesia: General

## 2024-11-14 MED ORDER — CEFAZOLIN SODIUM-DEXTROSE 2-4 GM/100ML-% IV SOLN
2.0000 g | INTRAVENOUS | Status: AC
  Administered 2024-11-14: 2 g via INTRAVENOUS
  Filled 2024-11-14: qty 100

## 2024-11-14 MED ORDER — FENTANYL CITRATE (PF) 100 MCG/2ML IJ SOLN
INTRAMUSCULAR | Status: DC | PRN
  Administered 2024-11-14: 50 ug via INTRAVENOUS

## 2024-11-14 MED ORDER — LACTATED RINGERS IV SOLN: INTRAVENOUS | Status: DC

## 2024-11-14 MED ORDER — CHLORHEXIDINE GLUCONATE 0.12 % MT SOLN
15.0000 mL | Freq: Once | OROMUCOSAL | Status: AC
  Administered 2024-11-14: 15 mL via OROMUCOSAL

## 2024-11-14 MED ORDER — FENTANYL CITRATE (PF) 100 MCG/2ML IJ SOLN
INTRAMUSCULAR | Status: AC
  Filled 2024-11-14: qty 2

## 2024-11-14 MED ORDER — SODIUM CHLORIDE 0.9 % IR SOLN
Status: DC | PRN
Start: 2024-11-14 — End: 2024-11-14
  Administered 2024-11-14: 3000 mL via INTRAVESICAL

## 2024-11-14 MED ORDER — DEXAMETHASONE SOD PHOSPHATE PF 10 MG/ML IJ SOLN
INTRAMUSCULAR | Status: DC | PRN
  Administered 2024-11-14: 4 mg via INTRAVENOUS

## 2024-11-14 MED ORDER — ORAL CARE MOUTH RINSE: 15.0000 mL | Freq: Once | OROMUCOSAL | Status: AC

## 2024-11-14 MED ORDER — ONDANSETRON HCL 4 MG/2ML IJ SOLN
INTRAMUSCULAR | Status: AC
  Filled 2024-11-14: qty 2

## 2024-11-14 MED ORDER — PROPOFOL 10 MG/ML IV BOLUS
INTRAVENOUS | Status: DC | PRN
  Administered 2024-11-14: 130 mg via INTRAVENOUS

## 2024-11-14 MED ORDER — LIDOCAINE HCL (PF) 2 % IJ SOLN
INTRAMUSCULAR | Status: DC | PRN
  Administered 2024-11-14: 80 mg via INTRADERMAL

## 2024-11-14 MED ORDER — LIDOCAINE HCL (PF) 2 % IJ SOLN
INTRAMUSCULAR | Status: AC
  Filled 2024-11-14: qty 5

## 2024-11-14 MED ORDER — ONDANSETRON HCL 4 MG/2ML IJ SOLN
INTRAMUSCULAR | Status: DC | PRN
  Administered 2024-11-14: 4 mg via INTRAVENOUS

## 2024-11-14 MED ORDER — PROPOFOL 10 MG/ML IV BOLUS
INTRAVENOUS | Status: AC
  Filled 2024-11-14: qty 20

## 2024-11-14 MED ORDER — OXYCODONE-ACETAMINOPHEN 5-325 MG PO TABS: 1.0000 | ORAL_TABLET | ORAL | 0 refills | Status: AC | PRN

## 2024-11-14 MED ORDER — EPHEDRINE SULFATE (PRESSORS) 25 MG/5ML IV SOSY
PREFILLED_SYRINGE | INTRAVENOUS | Status: DC | PRN
  Administered 2024-11-14 (×2): 10 mg via INTRAVENOUS

## 2024-11-14 SURGICAL SUPPLY — 27 items
BAG URINE DRAIN 2000ML AR STRL (UROLOGICAL SUPPLIES) ×1 IMPLANT
BAG URO CATCHER STRL LF (MISCELLANEOUS) ×1 IMPLANT
BALLOON NEPHROSTOMY (BALLOONS) IMPLANT
BALLOON OPTILUME DCB 30X3X75 (BALLOONS) IMPLANT
CATH FOLEY 2W COUNCIL 20FR 5CC (CATHETERS) IMPLANT
CATH FOLEY 2WAY SLVR 5CC 18FR (CATHETERS) IMPLANT
CATH ROBINSON RED A/P 14FR (CATHETERS) ×1 IMPLANT
CATH URETL OPEN END 6FR 70 (CATHETERS) IMPLANT
CLOTH BEACON ORANGE TIMEOUT ST (SAFETY) ×1 IMPLANT
DRAPE FOOT SWITCH (DRAPES) ×1 IMPLANT
ELECT REM PT RETURN 15FT ADLT (MISCELLANEOUS) ×1 IMPLANT
EVACUATOR MICROVAS BLADDER (UROLOGICAL SUPPLIES) IMPLANT
GLOVE BIOGEL M 7.0 STRL (GLOVE) ×1 IMPLANT
GOWN STRL REUS W/ TWL XL LVL3 (GOWN DISPOSABLE) ×1 IMPLANT
GUIDEWIRE ANG ZIPWIRE 038X150 (WIRE) IMPLANT
GUIDEWIRE STR DUAL SENSOR (WIRE) ×1 IMPLANT
HOLDER FOLEY CATH W/STRAP (MISCELLANEOUS) IMPLANT
KIT TURNOVER KIT A (KITS) ×1 IMPLANT
LOOP CUT BIPOLAR 24F LRG (ELECTROSURGICAL) IMPLANT
MANIFOLD NEPTUNE II (INSTRUMENTS) ×1 IMPLANT
NS IRRIG 1000ML POUR BTL (IV SOLUTION) IMPLANT
PACK CYSTO (CUSTOM PROCEDURE TRAY) ×1 IMPLANT
SCALPEL HALF MOON BLADE (MISCELLANEOUS) ×1 IMPLANT
SYRINGE TOOMEY IRRIG 70ML (MISCELLANEOUS) IMPLANT
TUBING CONNECTING 10 (TUBING) ×1 IMPLANT
TUBING UROLOGY SET (TUBING) ×1 IMPLANT
WATER STERILE IRR 3000ML UROMA (IV SOLUTION) ×1 IMPLANT

## 2024-11-14 NOTE — Discharge Instructions (Signed)
 Activity:  You are encouraged to ambulate frequently (about every hour during waking hours) to help prevent blood clots from forming in your legs or lungs.   ? ?Diet: You should advance your diet as instructed by your physician.  It will be normal to have some bloating, nausea, and abdominal discomfort intermittently. ? ?Prescriptions:  You will be provided a prescription for pain medication to take as needed.  If your pain is not severe enough to require the prescription pain medication, you may take extra strength Tylenol instead which will have less side effects.  You should also take a prescribed stool softener to avoid straining with bowel movements as the prescription pain medication may constipate you. ? ?What to call us about: You should call the office [phone]) if you develop fever > 101 or develop persistent vomiting. Activity:  You are encouraged to ambulate frequently (about every hour during waking hours) to help prevent blood clots from forming in your legs or lungs.   ? ?You have a foley catheter in place. Return to clinic in 3 days for foley removal. ? ?

## 2024-11-14 NOTE — Anesthesia Postprocedure Evaluation (Signed)
 Anesthesia Post Note  Patient: Kevin Lozano  Procedure(s) Performed: CYSTOSCOPY, WITH BIOPSY     Patient location during evaluation: PACU Anesthesia Type: General Level of consciousness: awake and alert Pain management: pain level controlled Vital Signs Assessment: post-procedure vital signs reviewed and stable Respiratory status: spontaneous breathing, nonlabored ventilation, respiratory function stable and patient connected to nasal cannula oxygen Cardiovascular status: blood pressure returned to baseline and stable Postop Assessment: no apparent nausea or vomiting Anesthetic complications: no   No notable events documented.  Last Vitals:  Vitals:   11/14/24 1515 11/14/24 1530  BP: 131/62 (!) 129/59  Pulse: (!) 53 62  Resp: 12 11  Temp: (!) 36.4 C   SpO2: 100% 100%    Last Pain:  Vitals:   11/14/24 1530  TempSrc:   PainSc: 0-No pain                 Garnette DELENA Gab

## 2024-11-14 NOTE — Anesthesia Procedure Notes (Signed)
 Procedure Name: LMA Insertion Date/Time: 11/14/2024 2:41 PM  Performed by: Nada Corean CROME, CRNAPre-anesthesia Checklist: Emergency Drugs available, Patient identified, Suction available, Patient being monitored and Timeout performed Patient Re-evaluated:Patient Re-evaluated prior to induction Oxygen Delivery Method: Circle system utilized Preoxygenation: Pre-oxygenation with 100% oxygen Induction Type: IV induction LMA: LMA inserted LMA Size: 4.0 Tube type: Oral Number of attempts: 1 Placement Confirmation: positive ETCO2 Tube secured with: Tape Dental Injury: Teeth and Oropharynx as per pre-operative assessment

## 2024-11-14 NOTE — Op Note (Signed)
 Operative Note  Preoperative diagnosis:  1.  Penile urethral lesion  Postoperative diagnosis: 1.  Penile urethral lesion  Procedure(s): 1.  Cystoscopy with biopsy of penile urethral lesion  Surgeon: Donnice Siad, MD  Assistants:  None  Anesthesia:  General  Complications:  None  EBL:  Minimal  Specimens: 1.  ID Type Source Tests Collected by Time Destination  1 : penile urethral biopsy Tissue PATH GU biopsy SURGICAL PATHOLOGY Siad Donnice SAUNDERS, MD 11/14/2024 1451    Drains/Catheters: 1.  18 French foley catheter  Intraoperative findings:   Approximate 2 cm whitish frondular lesion at the level of the base of the penile urethra that was biopsied successfully with excellent hemostasis. No further suspicious lesions within the bladder.  Indication:  Kevin Lozano is a 78 y.o. male with history of asymptomatic microscopic hematuria with cystoscopy in office that revealed approximately 2 cm wide frondular lesion in the base at the base of the penile urethra here for biopsy.  All the risks, benefits were discussed with the patient to include but not limited to infection, pain, bleeding, damage to adjacent structures, need for further operations, adverse reaction to anesthesia and death.  Patient understands these risks and agrees to proceed with the operation as planned.  Description of procedure: After informed consent was obtained from the patient, the patient was taken to the operating room. General anesthesia was administered. The patient was placed in dorsal lithotomy position and prepped and draped in usual sterile fashion. Sequential compression devices were applied to lower extremities at the beginning of the case for DVT prophylaxis. Antibiotics were infused prior to surgery start time. A surgical time-out was performed to properly identify the patient, the surgery to be performed, and the surgical site.     We then passed the 21-French rigid cystoscope down the urethra and into  the bladder under direct vision without any difficulty.  Survey of the anterior urethra revealed a 2 cm whitish frondular lesion at the 4 to 6 o'clock position at the base of the penile urethra within the urethra.  Cold cup biopsy was used to take several separate biopsies from the site and sent off as penile urethral biopsy.  Scope was passed into the bladder.  Prostate was nonobstructing.  Bladder was inspected with 30 to 70 degree lenses.  Once in the bladder, systematic evaluation of bladder revealed no suspicious lesions.  Ureteral orifices were in orthotopic position and not involved.  Surveyed the bladder and the urethra with no bleeding.  18 French Foley catheter was placed.  Patient tolerated procedure well with no complication and was taken to the recovery in stable condition.  Plan: Foley catheter stay in place for 3 days.  Matt R. Candas Deemer MD [facility]  Pager: [phone]

## 2024-11-14 NOTE — H&P (Signed)
 Urology Preoperative H&P   Chief Complaint: urethral lesion  History of Present Illness: Kevin Lozano is a 78 y.o. male with urethral lesion here for cysto, urethral biopsy.  1. Microscopic hematuria/ureteral lesion: Patient denies history of gross hematuria. He states he was evaluated with PCP recently and was found to have microscopic hematuria. He has extensive smoking history of smoking 1/2 pack/day for about 40 years. He denies family history of urologic malignancy. -CT ordered on 08/11/2024 however never done, still pending -Cysto 11/08/2024 revealed a nodular white approximate 2 cm lesion within the urethra near the base of the penile urethra that could be suspicious for neoplastic lesion.  He has no problematic lower urinary tract symptoms.  #2. Prostate cancer screening: Denies family history of prostate cancer. PSA most recent 1.2 in 03/2024.  Patient currently denies fever, chills, sweats, nausea, vomiting, abdominal or flank pain, gross hematuria or dysuria.  He has a past medical history of arthritis, hyperlipidemia. He has had bilateral inguinal hernia repairs, bilateral knee replacements and shoulder replacements. He denies any further abdominal surgeries.  Past Medical History:  Diagnosis Date   Arthritis    COPD (chronic obstructive pulmonary disease) (HCC)    Coronary artery calcification    GERD (gastroesophageal reflux disease)    rare   History of COVID-19 2021    Past Surgical History:  Procedure Laterality Date   CATARACT EXTRACTION Bilateral    COLONOSCOPY     HERNIA REPAIR Right 1971   inguinal   INGUINAL HERNIA REPAIR Left 08/01/2021   Procedure: OPEN LEFT INGUINAL HERNIA REPAIR WITH MESH;  Surgeon: Vernetta Berg, MD;  Location: [facility];  Service: General;  Laterality: Left;   ROTATOR CUFF REPAIR Right    SHOULDER ARTHROSCOPY WITH SUBACROMIAL DECOMPRESSION, ROTATOR CUFF REPAIR AND BICEP TENDON REPAIR Left 04/06/2023   Procedure: LEFT SHOULDER  ARTHROSCOPY, DEBRIDEMENT, MINI OPEN BICEPS TENODESIS AND ROTATOR CUFF TEAR REPAIR;  Surgeon: Addie Cordella Hamilton, MD;  Location: [facility];  Service: Orthopedics;  Laterality: Left;   TONSILLECTOMY     removed as a child   TOTAL KNEE ARTHROPLASTY Bilateral    WISDOM TOOTH EXTRACTION      Allergies:  Allergies  Allergen Reactions   Morphine Sulfate     Other Reaction(s): hallucinations   Sulfa Antibiotics Rash    Family History  Problem Relation Age of Onset   Hyperlipidemia Mother    Hyperlipidemia Father     Social History:  reports that he has been smoking cigarettes. He has never used smokeless tobacco. He reports that he does not drink alcohol and does not use drugs.  ROS: A complete review of systems was performed.  All systems are negative except for pertinent findings as noted.  Physical Exam:  Vital signs in last 24 hours: Temp:  [97.5 F (36.4 C)] 97.5 F (36.4 C) (11/24 1147) Pulse Rate:  [51] 51 (11/24 1147) Resp:  [16] 16 (11/24 1147) BP: (135)/(79) 135/79 (11/24 1147) SpO2:  [100 %] 100 % (11/24 1147) Weight:  [64.9 kg] 64.9 kg (11/24 1218) Constitutional:  Alert and oriented, No acute distress Cardiovascular: Regular rate and rhythm Respiratory: Normal respiratory effort, Lungs clear bilaterally GI: Abdomen is soft, nontender, nondistended, no abdominal masses GU: No CVA tenderness Lymphatic: No lymphadenopathy Neurologic: Grossly intact, no focal deficits Psychiatric: Normal mood and affect  Laboratory Data:  Recent Labs    11/14/24 1225  WBC 9.3  HGB 14.5  HCT 44.5  PLT 264    No results for input(s):  NA, K, CL, GLUCOSE, BUN, CALCIUM, CREATININE in the last 72 hours.  Invalid input(s): CO3   Results for orders placed or performed during the hospital encounter of 11/14/24 (from the past 24 hours)  CBC per protocol     Status: None   Collection Time: 11/14/24 12:25 PM  Result Value Ref Range   WBC 9.3 4.0 - 10.5 K/uL   RBC  4.62 4.22 - 5.81 MIL/uL   Hemoglobin 14.5 13.0 - 17.0 g/dL   HCT 55.4 60.9 - 47.9 %   MCV 96.3 80.0 - 100.0 fL   MCH 31.4 26.0 - 34.0 pg   MCHC 32.6 30.0 - 36.0 g/dL   RDW 86.6 88.4 - 84.4 %   Platelets 264 150 - 400 K/uL   nRBC 0.0 0.0 - 0.2 %   No results found for this or any previous visit (from the past 240 hours).  Renal Function: No results for input(s): CREATININE in the last 168 hours. CrCl cannot be calculated (Patient's most recent lab result is older than the maximum 21 days allowed.).  Radiologic Imaging: No results found.  I independently reviewed the above imaging studies.  Assessment and Plan Kevin Lozano is a 78 y.o. male with a urethral lesion here for cysto and biopsy.   Matt R. Keerthana Vanrossum MD 11/14/2024, 1:13 PM  [facility] Pager: [phone]): [phone]

## 2024-11-14 NOTE — Transfer of Care (Signed)
 Immediate Anesthesia Transfer of Care Note  Patient: Kevin Lozano  Procedure(s) Performed: CYSTOSCOPY, WITH BIOPSY  Patient Location: PACU  Anesthesia Type:General  Level of Consciousness: awake, alert , oriented, and patient cooperative  Airway & Oxygen Therapy: Patient Spontanous Breathing and Patient connected to face mask oxygen  Post-op Assessment: Report given to RN and Post -op Vital signs reviewed and stable  Post vital signs: Reviewed and stable  Last Vitals:  Vitals Value Taken Time  BP 141/60 11/14/24 15:10  Temp    Pulse 55 11/14/24 15:12  Resp 11 11/14/24 15:12  SpO2 100 % 11/14/24 15:12  Vitals shown include unfiled device data.  Last Pain:  Vitals:   11/14/24 1218  TempSrc:   PainSc: 0-No pain         Complications: No notable events documented.

## 2024-11-15 ENCOUNTER — Encounter (HOSPITAL_COMMUNITY): Payer: Self-pay | Admitting: Urology

## 2024-11-15 NOTE — Addendum Note (Signed)
 Addendum  created 11/15/24 0759 by Augusta Daved SAILOR, CRNA   Intraprocedure Event edited

## 2024-11-21 LAB — SURGICAL PATHOLOGY

## 2024-11-22 DIAGNOSIS — R3121 Asymptomatic microscopic hematuria: Secondary | ICD-10-CM | POA: Diagnosis not present

## 2024-11-22 DIAGNOSIS — D413 Neoplasm of uncertain behavior of urethra: Secondary | ICD-10-CM | POA: Diagnosis not present

## 2024-11-29 DIAGNOSIS — R3121 Asymptomatic microscopic hematuria: Secondary | ICD-10-CM | POA: Diagnosis not present

## 2024-12-16 ENCOUNTER — Other Ambulatory Visit: Payer: Self-pay | Admitting: Family Medicine

## 2024-12-16 DIAGNOSIS — K869 Disease of pancreas, unspecified: Secondary | ICD-10-CM

## 2025-01-16 ENCOUNTER — Other Ambulatory Visit

## 2025-02-16 ENCOUNTER — Other Ambulatory Visit
